# Patient Record
Sex: Female | Born: 1970 | Race: Black or African American | Hispanic: No | Marital: Married | Smoking: Never smoker
Health system: Southern US, Community
[De-identification: ages and names within clinical notes are randomized; demographics above are authoritative.]

## PROBLEM LIST (undated history)

## (undated) DIAGNOSIS — K219 Gastro-esophageal reflux disease without esophagitis: Secondary | ICD-10-CM

## (undated) DIAGNOSIS — C801 Malignant (primary) neoplasm, unspecified: Secondary | ICD-10-CM

## (undated) DIAGNOSIS — K0889 Other specified disorders of teeth and supporting structures: Secondary | ICD-10-CM

## (undated) DIAGNOSIS — D649 Anemia, unspecified: Secondary | ICD-10-CM

## (undated) DIAGNOSIS — I10 Essential (primary) hypertension: Secondary | ICD-10-CM

## (undated) DIAGNOSIS — L02213 Cutaneous abscess of chest wall: Secondary | ICD-10-CM

## (undated) DIAGNOSIS — L739 Follicular disorder, unspecified: Secondary | ICD-10-CM

## (undated) HISTORY — DX: Cutaneous abscess of chest wall: L02.213

## (undated) HISTORY — DX: Essential (primary) hypertension: I10

## (undated) HISTORY — DX: Malignant (primary) neoplasm, unspecified: C80.1

## (undated) HISTORY — PX: OTHER SURGICAL HISTORY: SHX169

## (undated) HISTORY — PX: BREAST REDUCTION SURGERY: SHX8

## (undated) HISTORY — PX: EYE SURGERY: SHX253

## (undated) HISTORY — DX: Follicular disorder, unspecified: L73.9

## (undated) HISTORY — PX: MASTECTOMY: SHX3

## (undated) HISTORY — PX: INCISE AND DRAIN ABCESS: PRO64

---

## 1998-12-12 ENCOUNTER — Encounter (INDEPENDENT_AMBULATORY_CARE_PROVIDER_SITE_OTHER): Payer: Self-pay | Admitting: *Deleted

## 1998-12-12 ENCOUNTER — Other Ambulatory Visit: Admission: RE | Admit: 1998-12-12 | Discharge: 1998-12-12 | Payer: Self-pay | Admitting: Obstetrics and Gynecology

## 1999-02-12 ENCOUNTER — Other Ambulatory Visit: Admission: RE | Admit: 1999-02-12 | Discharge: 1999-02-12 | Payer: Self-pay | Admitting: Obstetrics and Gynecology

## 1999-02-12 ENCOUNTER — Encounter (INDEPENDENT_AMBULATORY_CARE_PROVIDER_SITE_OTHER): Payer: Self-pay | Admitting: Specialist

## 2001-10-16 ENCOUNTER — Other Ambulatory Visit: Admission: RE | Admit: 2001-10-16 | Discharge: 2001-10-16 | Payer: Self-pay | Admitting: Obstetrics and Gynecology

## 2002-01-08 ENCOUNTER — Encounter: Admission: RE | Admit: 2002-01-08 | Discharge: 2002-01-08 | Payer: Self-pay | Admitting: Family Medicine

## 2002-01-08 ENCOUNTER — Encounter: Payer: Self-pay | Admitting: Family Medicine

## 2002-06-14 HISTORY — PX: BREAST SURGERY: SHX581

## 2002-08-07 ENCOUNTER — Encounter: Payer: Self-pay | Admitting: Surgery

## 2002-08-09 ENCOUNTER — Encounter (INDEPENDENT_AMBULATORY_CARE_PROVIDER_SITE_OTHER): Payer: Self-pay | Admitting: Specialist

## 2002-08-09 ENCOUNTER — Observation Stay (HOSPITAL_COMMUNITY): Admission: RE | Admit: 2002-08-09 | Discharge: 2002-08-10 | Payer: Self-pay | Admitting: Surgery

## 2002-09-17 ENCOUNTER — Other Ambulatory Visit: Admission: RE | Admit: 2002-09-17 | Discharge: 2002-09-17 | Payer: Self-pay | Admitting: Plastic Surgery

## 2002-10-15 ENCOUNTER — Encounter: Payer: Self-pay | Admitting: Oncology

## 2002-10-15 ENCOUNTER — Encounter: Admission: RE | Admit: 2002-10-15 | Discharge: 2002-10-15 | Payer: Self-pay | Admitting: Oncology

## 2002-10-23 ENCOUNTER — Other Ambulatory Visit: Admission: RE | Admit: 2002-10-23 | Discharge: 2002-10-23 | Payer: Self-pay | Admitting: Obstetrics and Gynecology

## 2003-04-12 ENCOUNTER — Encounter: Admission: RE | Admit: 2003-04-12 | Discharge: 2003-04-12 | Payer: Self-pay | Admitting: Oncology

## 2003-11-05 ENCOUNTER — Other Ambulatory Visit: Admission: RE | Admit: 2003-11-05 | Discharge: 2003-11-05 | Payer: Self-pay | Admitting: Obstetrics and Gynecology

## 2004-04-14 ENCOUNTER — Encounter: Admission: RE | Admit: 2004-04-14 | Discharge: 2004-04-14 | Payer: Self-pay | Admitting: Oncology

## 2004-07-08 ENCOUNTER — Ambulatory Visit (HOSPITAL_BASED_OUTPATIENT_CLINIC_OR_DEPARTMENT_OTHER): Admission: RE | Admit: 2004-07-08 | Discharge: 2004-07-08 | Payer: Self-pay | Admitting: Plastic Surgery

## 2004-08-12 ENCOUNTER — Ambulatory Visit (HOSPITAL_COMMUNITY): Admission: RE | Admit: 2004-08-12 | Discharge: 2004-08-12 | Payer: Self-pay | Admitting: Plastic Surgery

## 2004-08-27 ENCOUNTER — Ambulatory Visit: Payer: Self-pay | Admitting: Oncology

## 2004-12-22 ENCOUNTER — Ambulatory Visit: Payer: Self-pay | Admitting: Oncology

## 2005-04-09 ENCOUNTER — Ambulatory Visit: Payer: Self-pay | Admitting: Oncology

## 2005-04-15 ENCOUNTER — Encounter: Admission: RE | Admit: 2005-04-15 | Discharge: 2005-04-15 | Payer: Self-pay | Admitting: Oncology

## 2005-06-08 ENCOUNTER — Ambulatory Visit: Payer: Self-pay | Admitting: Oncology

## 2005-06-18 ENCOUNTER — Inpatient Hospital Stay (HOSPITAL_COMMUNITY): Admission: RE | Admit: 2005-06-18 | Discharge: 2005-06-22 | Payer: Self-pay | Admitting: Plastic Surgery

## 2005-08-03 ENCOUNTER — Ambulatory Visit: Payer: Self-pay | Admitting: Oncology

## 2006-01-26 ENCOUNTER — Ambulatory Visit: Payer: Self-pay | Admitting: Oncology

## 2006-01-26 LAB — CBC WITH DIFFERENTIAL/PLATELET
BASO%: 0.6 % (ref 0.0–2.0)
Eosinophils Absolute: 0.3 10*3/uL (ref 0.0–0.5)
MCV: 88.6 fL (ref 81.0–101.0)
MONO%: 6 % (ref 0.0–13.0)
NEUT#: 3.2 10*3/uL (ref 1.5–6.5)
RBC: 3.81 10*6/uL (ref 3.70–5.32)
RDW: 13.5 % (ref 11.3–14.5)
WBC: 6.2 10*3/uL (ref 3.9–10.0)

## 2006-01-26 LAB — COMPREHENSIVE METABOLIC PANEL
ALT: 13 U/L (ref 0–40)
AST: 15 U/L (ref 0–37)
Albumin: 4.1 g/dL (ref 3.5–5.2)
Alkaline Phosphatase: 61 U/L (ref 39–117)
Glucose, Bld: 88 mg/dL (ref 70–99)
Potassium: 4.4 mEq/L (ref 3.5–5.3)
Sodium: 137 mEq/L (ref 135–145)
Total Protein: 7.9 g/dL (ref 6.0–8.3)

## 2006-01-26 LAB — IRON AND TIBC: Iron: 90 ug/dL (ref 42–145)

## 2006-04-18 ENCOUNTER — Encounter: Admission: RE | Admit: 2006-04-18 | Discharge: 2006-04-18 | Payer: Self-pay | Admitting: Oncology

## 2006-06-03 ENCOUNTER — Ambulatory Visit (HOSPITAL_COMMUNITY): Admission: RE | Admit: 2006-06-03 | Discharge: 2006-06-03 | Payer: Self-pay | Admitting: Obstetrics and Gynecology

## 2006-07-21 ENCOUNTER — Ambulatory Visit: Payer: Self-pay | Admitting: Oncology

## 2006-07-26 LAB — COMPREHENSIVE METABOLIC PANEL
Alkaline Phosphatase: 62 U/L (ref 39–117)
CO2: 21 mEq/L (ref 19–32)
Creatinine, Ser: 1.47 mg/dL — ABNORMAL HIGH (ref 0.40–1.20)
Glucose, Bld: 88 mg/dL (ref 70–99)
Total Bilirubin: 0.4 mg/dL (ref 0.3–1.2)

## 2006-07-26 LAB — CBC WITH DIFFERENTIAL/PLATELET
BASO%: 0.4 % (ref 0.0–2.0)
Eosinophils Absolute: 0.2 10*3/uL (ref 0.0–0.5)
HCT: 33.5 % — ABNORMAL LOW (ref 34.8–46.6)
LYMPH%: 38.8 % (ref 14.0–48.0)
MCHC: 35.1 g/dL (ref 32.0–36.0)
MCV: 86 fL (ref 81.0–101.0)
MONO#: 0.8 10*3/uL (ref 0.1–0.9)
MONO%: 8.7 % (ref 0.0–13.0)
NEUT%: 49.9 % (ref 39.6–76.8)
Platelets: 328 10*3/uL (ref 145–400)
WBC: 9.6 10*3/uL (ref 3.9–10.0)

## 2006-09-06 ENCOUNTER — Ambulatory Visit (HOSPITAL_COMMUNITY): Admission: RE | Admit: 2006-09-06 | Discharge: 2006-09-06 | Payer: Self-pay | Admitting: Obstetrics and Gynecology

## 2007-01-20 ENCOUNTER — Ambulatory Visit: Payer: Self-pay | Admitting: Oncology

## 2007-01-24 LAB — CBC WITH DIFFERENTIAL/PLATELET
Basophils Absolute: 0.1 10*3/uL (ref 0.0–0.1)
Eosinophils Absolute: 0.3 10*3/uL (ref 0.0–0.5)
HCT: 33.4 % — ABNORMAL LOW (ref 34.8–46.6)
LYMPH%: 35.7 % (ref 14.0–48.0)
MONO#: 0.8 10*3/uL (ref 0.1–0.9)
NEUT#: 4.9 10*3/uL (ref 1.5–6.5)
NEUT%: 51.5 % (ref 39.6–76.8)
Platelets: 308 10*3/uL (ref 145–400)
WBC: 9.6 10*3/uL (ref 3.9–10.0)

## 2007-01-24 LAB — COMPREHENSIVE METABOLIC PANEL
BUN: 18 mg/dL (ref 6–23)
CO2: 19 mEq/L (ref 19–32)
Creatinine, Ser: 1.28 mg/dL — ABNORMAL HIGH (ref 0.40–1.20)
Glucose, Bld: 113 mg/dL — ABNORMAL HIGH (ref 70–99)
Total Bilirubin: 0.3 mg/dL (ref 0.3–1.2)
Total Protein: 7.7 g/dL (ref 6.0–8.3)

## 2007-04-24 ENCOUNTER — Encounter: Admission: RE | Admit: 2007-04-24 | Discharge: 2007-04-24 | Payer: Self-pay | Admitting: Oncology

## 2007-04-28 ENCOUNTER — Encounter: Admission: RE | Admit: 2007-04-28 | Discharge: 2007-04-28 | Payer: Self-pay | Admitting: Oncology

## 2007-07-31 ENCOUNTER — Ambulatory Visit: Payer: Self-pay | Admitting: Oncology

## 2007-08-02 LAB — CBC WITH DIFFERENTIAL/PLATELET
Basophils Absolute: 0 10*3/uL (ref 0.0–0.1)
EOS%: 2.7 % (ref 0.0–7.0)
HCT: 33.3 % — ABNORMAL LOW (ref 34.8–46.6)
HGB: 11.5 g/dL — ABNORMAL LOW (ref 11.6–15.9)
LYMPH%: 35.2 % (ref 14.0–48.0)
MCH: 29.9 pg (ref 26.0–34.0)
MCV: 86.6 fL (ref 81.0–101.0)
MONO%: 8 % (ref 0.0–13.0)
NEUT%: 53.7 % (ref 39.6–76.8)
Platelets: 313 10*3/uL (ref 145–400)

## 2007-08-02 LAB — BASIC METABOLIC PANEL
BUN: 14 mg/dL (ref 6–23)
Chloride: 102 mEq/L (ref 96–112)
Creatinine, Ser: 1.08 mg/dL (ref 0.40–1.20)

## 2008-01-28 ENCOUNTER — Ambulatory Visit: Payer: Self-pay | Admitting: Oncology

## 2008-01-31 LAB — CBC WITH DIFFERENTIAL/PLATELET
BASO%: 0.5 % (ref 0.0–2.0)
Basophils Absolute: 0 10*3/uL (ref 0.0–0.1)
EOS%: 25.4 % — ABNORMAL HIGH (ref 0.0–7.0)
HCT: 32.7 % — ABNORMAL LOW (ref 34.8–46.6)
HGB: 11.1 g/dL — ABNORMAL LOW (ref 11.6–15.9)
MCH: 29.8 pg (ref 26.0–34.0)
MCHC: 34.1 g/dL (ref 32.0–36.0)
MCV: 87.5 fL (ref 81.0–101.0)
MONO%: 6.1 % (ref 0.0–13.0)
NEUT%: 34.3 % — ABNORMAL LOW (ref 39.6–76.8)
RDW: 15.3 % — ABNORMAL HIGH (ref 11.3–14.5)

## 2008-01-31 LAB — COMPREHENSIVE METABOLIC PANEL
AST: 15 U/L (ref 0–37)
Alkaline Phosphatase: 73 U/L (ref 39–117)
BUN: 10 mg/dL (ref 6–23)
Creatinine, Ser: 0.92 mg/dL (ref 0.40–1.20)

## 2008-04-25 ENCOUNTER — Encounter: Admission: RE | Admit: 2008-04-25 | Discharge: 2008-04-25 | Payer: Self-pay | Admitting: Oncology

## 2009-03-17 ENCOUNTER — Ambulatory Visit: Payer: Self-pay | Admitting: Oncology

## 2009-03-19 LAB — COMPREHENSIVE METABOLIC PANEL
ALT: 13 U/L (ref 0–35)
AST: 15 U/L (ref 0–37)
Albumin: 3.8 g/dL (ref 3.5–5.2)
Alkaline Phosphatase: 71 U/L (ref 39–117)
BUN: 8 mg/dL (ref 6–23)
Chloride: 105 mEq/L (ref 96–112)
Potassium: 4.3 mEq/L (ref 3.5–5.3)

## 2009-03-19 LAB — CBC WITH DIFFERENTIAL/PLATELET
Basophils Absolute: 0 10*3/uL (ref 0.0–0.1)
Eosinophils Absolute: 0.5 10*3/uL (ref 0.0–0.5)
HGB: 12 g/dL (ref 11.6–15.9)
MONO#: 0.5 10*3/uL (ref 0.1–0.9)
MONO%: 6.5 % (ref 0.0–14.0)
NEUT#: 4.3 10*3/uL (ref 1.5–6.5)
RBC: 3.96 10*6/uL (ref 3.70–5.45)
RDW: 14.9 % — ABNORMAL HIGH (ref 11.2–14.5)
WBC: 7.6 10*3/uL (ref 3.9–10.3)
lymph#: 2.3 10*3/uL (ref 0.9–3.3)

## 2009-03-19 LAB — MORPHOLOGY: PLT EST: ADEQUATE

## 2009-04-17 ENCOUNTER — Ambulatory Visit: Payer: Self-pay | Admitting: Genetic Counselor

## 2009-04-28 ENCOUNTER — Encounter: Admission: RE | Admit: 2009-04-28 | Discharge: 2009-04-28 | Payer: Self-pay | Admitting: Oncology

## 2009-04-30 ENCOUNTER — Encounter: Admission: RE | Admit: 2009-04-30 | Discharge: 2009-04-30 | Payer: Self-pay | Admitting: Oncology

## 2009-11-20 ENCOUNTER — Ambulatory Visit: Payer: Self-pay | Admitting: Diagnostic Radiology

## 2009-11-20 ENCOUNTER — Emergency Department (HOSPITAL_BASED_OUTPATIENT_CLINIC_OR_DEPARTMENT_OTHER): Admission: EM | Admit: 2009-11-20 | Discharge: 2009-11-20 | Payer: Self-pay | Admitting: Emergency Medicine

## 2010-03-18 ENCOUNTER — Ambulatory Visit: Payer: Self-pay | Admitting: Oncology

## 2010-03-18 ENCOUNTER — Ambulatory Visit: Payer: Self-pay | Admitting: Genetic Counselor

## 2010-03-18 LAB — COMPREHENSIVE METABOLIC PANEL
ALT: 14 U/L (ref 0–35)
AST: 16 U/L (ref 0–37)
Albumin: 4 g/dL (ref 3.5–5.2)
Alkaline Phosphatase: 78 U/L (ref 39–117)
BUN: 9 mg/dL (ref 6–23)
CO2: 25 mEq/L (ref 19–32)
Calcium: 9.3 mg/dL (ref 8.4–10.5)
Chloride: 102 mEq/L (ref 96–112)
Creatinine, Ser: 0.95 mg/dL (ref 0.40–1.20)
Glucose, Bld: 120 mg/dL — ABNORMAL HIGH (ref 70–99)
Potassium: 3.8 mEq/L (ref 3.5–5.3)
Sodium: 136 mEq/L (ref 135–145)
Total Bilirubin: 0.7 mg/dL (ref 0.3–1.2)
Total Protein: 7.2 g/dL (ref 6.0–8.3)

## 2010-03-18 LAB — CBC WITH DIFFERENTIAL/PLATELET
BASO%: 0.7 % (ref 0.0–2.0)
Basophils Absolute: 0 10*3/uL (ref 0.0–0.1)
EOS%: 3.4 % (ref 0.0–7.0)
Eosinophils Absolute: 0.2 10*3/uL (ref 0.0–0.5)
HCT: 36.2 % (ref 34.8–46.6)
HGB: 12 g/dL (ref 11.6–15.9)
LYMPH%: 33.2 % (ref 14.0–49.7)
MCH: 29.7 pg (ref 25.1–34.0)
MCHC: 33 g/dL (ref 31.5–36.0)
MCV: 89.9 fL (ref 79.5–101.0)
MONO#: 0.3 10*3/uL (ref 0.1–0.9)
MONO%: 4.6 % (ref 0.0–14.0)
NEUT#: 4.1 10*3/uL (ref 1.5–6.5)
NEUT%: 58.1 % (ref 38.4–76.8)
Platelets: 321 10*3/uL (ref 145–400)
RBC: 4.03 10*6/uL (ref 3.70–5.45)
RDW: 15 % — ABNORMAL HIGH (ref 11.2–14.5)
WBC: 7.1 10*3/uL (ref 3.9–10.3)
lymph#: 2.4 10*3/uL (ref 0.9–3.3)

## 2010-03-18 LAB — TSH: TSH: 3.593 u[IU]/mL (ref 0.350–4.500)

## 2010-05-06 ENCOUNTER — Encounter: Admission: RE | Admit: 2010-05-06 | Discharge: 2010-05-06 | Payer: Self-pay | Admitting: Oncology

## 2010-06-24 ENCOUNTER — Ambulatory Visit: Payer: Self-pay | Admitting: Oncology

## 2010-07-04 ENCOUNTER — Encounter: Payer: Self-pay | Admitting: Oncology

## 2010-08-31 LAB — CBC
HCT: 37.7 % (ref 36.0–46.0)
Hemoglobin: 12.4 g/dL (ref 12.0–15.0)
MCHC: 32.9 g/dL (ref 30.0–36.0)
MCV: 89.8 fL (ref 78.0–100.0)
Platelets: 278 10*3/uL (ref 150–400)
RBC: 4.2 MIL/uL (ref 3.87–5.11)
RDW: 13.9 % (ref 11.5–15.5)
WBC: 8.4 10*3/uL (ref 4.0–10.5)

## 2010-08-31 LAB — DIFFERENTIAL
Basophils Absolute: 0.2 10*3/uL — ABNORMAL HIGH (ref 0.0–0.1)
Basophils Relative: 2 % — ABNORMAL HIGH (ref 0–1)
Eosinophils Absolute: 0.3 10*3/uL (ref 0.0–0.7)
Eosinophils Relative: 4 % (ref 0–5)
Lymphocytes Relative: 35 % (ref 12–46)
Lymphs Abs: 2.9 10*3/uL (ref 0.7–4.0)
Monocytes Absolute: 0.6 10*3/uL (ref 0.1–1.0)
Monocytes Relative: 7 % (ref 3–12)
Neutro Abs: 4.4 10*3/uL (ref 1.7–7.7)
Neutrophils Relative %: 53 % (ref 43–77)

## 2010-08-31 LAB — COMPREHENSIVE METABOLIC PANEL
ALT: 16 U/L (ref 0–35)
AST: 25 U/L (ref 0–37)
Albumin: 4.1 g/dL (ref 3.5–5.2)
Alkaline Phosphatase: 89 U/L (ref 39–117)
BUN: 9 mg/dL (ref 6–23)
CO2: 25 mEq/L (ref 19–32)
Calcium: 9.6 mg/dL (ref 8.4–10.5)
Chloride: 104 mEq/L (ref 96–112)
Creatinine, Ser: 0.9 mg/dL (ref 0.4–1.2)
GFR calc Af Amer: >60 mL/min (ref >60)
GFR calc non Af Amer: >60 mL/min (ref >60)
Glucose, Bld: 94 mg/dL (ref 70–99)
Potassium: 4.2 mEq/L (ref 3.5–5.1)
Sodium: 142 mEq/L (ref 135–145)
Total Bilirubin: 0.7 mg/dL (ref 0.3–1.2)
Total Protein: 8.5 g/dL — ABNORMAL HIGH (ref 6.0–8.3)

## 2010-08-31 LAB — POCT CARDIAC MARKERS
CKMB, poc: <1 ng/mL — ABNORMAL LOW (ref 1.0–8.0)
CKMB, poc: <1 ng/mL — ABNORMAL LOW (ref 1.0–8.0)
Myoglobin, poc: 72 ng/mL (ref 12–200)
Myoglobin, poc: 75 ng/mL (ref 12–200)
Troponin i, poc: <0.05 ng/mL (ref 0.00–0.09)
Troponin i, poc: <0.05 ng/mL (ref 0.00–0.09)

## 2010-08-31 LAB — LIPASE, BLOOD: Lipase: 94 U/L (ref 23–300)

## 2010-08-31 LAB — D-DIMER, QUANTITATIVE: D-Dimer, Quant: 0.24 ug/mL-FEU (ref 0.00–0.48)

## 2010-08-31 LAB — PREGNANCY, URINE: Preg Test, Ur: NEGATIVE

## 2010-10-30 NOTE — H&P (Signed)
[name], [name]                ACCOUNT NO.:  [id number]   MEDICAL RECORD NO.:  [id number]          PATIENT TYPE:  INP   LOCATION:  2899                         FACILITY:  [facility]   PHYSICIAN:  Consuello Bossier., M.D.DATE OF BIRTH:  21-Oct-1970   DATE OF ADMISSION:  06/18/2005  DATE OF DISCHARGE:                                HISTORY & PHYSICAL   HISTORY OF PRESENT ILLNESS:  This 40 year old female is admitted for delayed  breast reconstruction.  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  After much deliberation, decided to proceed with a right total  mastectomy.  She has been treated with tamoxifen since that time.  She is  overweight, and because of this and because of difficulty in losing weight,  it was elected to delay a TRAM flap in January of this past year.  She was  scheduled to have a right TRAM flap performed subsequent to that time, but  was noted to have a low hemoglobin.  She has been seen by Dr. Ottie Glazier P.  Darrold Span and has been placed on various medications, including ferrous  sulfate, to bring her hemoglobin up as much as it could be brought up.  Also  plans were made to obtain an autologous transfusion to be used if necessary.  She is admitted to the hospital at this time for the surgery.   PAST MEDICAL HISTORY:  She had some gastroesophageal reflux disease.  Was  taking the tamoxifen and the ferrous sulfate, but otherwise was healthy with  no long-term medical problems.   REVIEW OF SYSTEMS:  Noncontributory.   PHYSICAL EXAMINATION:  GENERAL:  The patient is a cooperative, 40 year old  female, in no distress.  HEENT:  Negative.  LUNGS:  Negative.  HEART:  Negative.  ABDOMEN:  Soft, no organomegaly, masses or tenderness.  There is a lower  abdominal transverse scar from her previous delay  of the TRAM flap, which is  well-healed.  BREASTS:  There is an absent right breast with a transverse mastectomy scar  with stitch marks on either side.  There was no local evidence of chest wall  disease or axillary or supraclavicular adenopathy.  The left breast, which  is reasonably large, has the scars following her previous reduction  mammoplasty.  EXTREMITIES:  No peripheral clubbing, cyanosis or edema.   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   DISPOSITION:  As noted above, the patient is admitted at this point for the  delayed breast reconstruction, following the delay of the TRAM flap and  treatment of her underlying chronic anemia.  She has been appraised of the  operation, as well as the risks, including the possibility of complications  related to the flap, such as the possibility of  flap failure, untoward scarring, the fact that multiple procedures normally  required and breast reconstruction, to  achieve maximum breast symmetry, the  possibility of infection, bleeding or other things that are more common in  patients who are overweight than ones who are not.  She is anxious to  proceed at this point.      Consuello Bossier., M.D.  Electronically Signed     HH/MEDQ  D:  06/18/2005  T:  06/18/2005  Job:  [id number]

## 2010-10-30 NOTE — Discharge Summary (Signed)
Courtney [name], Courtney [name]                ACCOUNT NO.:  [id number]   MEDICAL RECORD NO.:  [id number]          PATIENT TYPE:  INP   LOCATION:  5710                         FACILITY:  [facility]   PHYSICIAN:  Consuello Bossier., M.D.DATE OF BIRTH:  05-07-1971   DATE OF ADMISSION:  06/18/2005  DATE OF DISCHARGE:  06/22/2005                                 DISCHARGE SUMMARY   HISTORY OF PRESENT ILLNESS:  This is a 40 year old female who is admitted  for delayed breast construction.  The patient had previously undergone  bilateral mammoplasty and carcinoma  was found in the right breast specimen  and she subsequently underwent a right total mastectomy.  She has been on  Tamoxifen since that time.  She was a candidate for breast reconstruction  and had a delay of her tram flap because of her being so heavy and then when  she was scheduled to have her tram flap performed she was noted to be  anemic.  She was then evaluated for the anemia and blood count is raised and  currently is on iron.  She was admitted for the above surgical procedure.   PAST MEDICAL HISTORY AND REVIEW SYSTEMS:  Essentially noncontributory.   PERTINENT PHYSICAL EXAMINATION:  Showed an absent right breast with a  transverse mastectomy scar with stitch marks on either side.  No evidence of  any local chest wall disease, axillary or supraclavicular adenopathy.  The  left breast is reasonably enlarged and has a scar from previous left  reduction mammoplasty.   LABORATORY DATA:  Showed a hemoglobin of 12.4, hematocrit 36.7 on admission.   HOSPITAL COURSE:  Hospital course in the morning of admission, the patient  was taken to the operating room under general anesthesia of the tram flap of  right breast reconstruction was performed.  Postoperatively she has done  well.  Her hemoglobin dropped initially to 9.1 on January 5, but on January  6, was 10.1 and she was doing well.  She was able to be discharged and due  to be followed by  me as an outpatient.  She is on no pain medication and is  back on her iron and Senokot and Tamoxifen. I will see her in the office in  3 days for removal of her drains or before if any problems.      Consuello Bossier., M.D.  Electronically Signed     HH/MEDQ  D:  06/22/2005  T:  06/22/2005  Job:  [id number]

## 2010-10-30 NOTE — Op Note (Signed)
NAMESHARIFAH, Courtney [name]                ACCOUNT NO.:  [id number]   MEDICAL RECORD NO.:  [id number]          PATIENT TYPE:  AMB   LOCATION:  SDC                           FACILITY:  [facility]   PHYSICIAN:  Sherry A. Dickstein, M.D.DATE OF BIRTH:  [birth year]   DATE OF PROCEDURE:  06/03/2006  DATE OF DISCHARGE:                               OPERATIVE REPORT   PREOPERATIVE DIAGNOSIS:  Desire for sterilization.   POSTOPERATIVE DIAGNOSIS:  Desire for sterilization.   PROCEDURE:  Essure tubal occlusion.   SURGEON:  Sherry A. Rosalio Macadamia, M.D.   ANESTHESIA:  MAC.   INDICATIONS:  This is a 40 year old G1, P1-0-0-1 woman who requests  permanent sterilization procedure.  She understands the risks involved  as well as the failure rate to this procedure.  She requests only Essure  procedure.  She would not agreed to a laparoscopic tubal procedure.  The  patient is status post breast cancer and is currently on tamoxifen.   FINDINGS:  A normal sized anteflexed uterus with no endometrial polyps.  Both cornua could be visualized.  No adnexal masses.   PROCEDURE:  The patient is brought into the operating room and given  adequate IV sedation.  She was placed in dorsal lithotomy position.  Her  perineum was washed with Betadine.  Pelvic examination was performed.  She was draped in a sterile fashion.  Surgeon's gown and gloves were  changed.  Speculum was placed within the vagina.  Vagina was washed with  Betadine.  Paracervical block was administered with 1% Nesacaine.  Anterior lip of the cervix was grasped with single-tooth tenaculum.  Cervix was sounded.  Cervix was dilated with a Pratt dilator just to a  #17.  The hysteroscope was introduced into the endometrial cavity.  Pictures were obtained, both cornua were visualized.  The Essure  instrument was able to be threaded into the left cornua without any  difficulty.  It was deployed in the normal fashion and released.  After  being released,  approximately two coils were visualized, picture was  obtained.  The same procedure was then performed on the right cornua.  After it was deployed and released, approximately four coils were  visualized.  After revisualization the left cornua, the coils could not  be visualized at the end of the procedure, although they had been  visualized at the end of deployment of the instrument.  At the end of  the procedure, the entire cavity was visualized and no polyps were seen  in the endometrial cavity, no thickening of the endometrial tissue was  seen.  All instruments removed from the vagina.  The patient was taken  out of the dorsal lithotomy position.  She was awakened.  She was moved  from the operating table to a stretcher in stable condition.   COMPLICATIONS:  None.   ESTIMATED BLOOD LOSS:  Less than 5 mL.   SORBITOL DIFFERENTIAL:  Minus 50 mL.      Sherry A. Rosalio Macadamia, M.D.  Electronically Signed     SAD/MEDQ  D:  06/03/2006  T:  06/03/2006  Job:  [id number]

## 2011-03-23 ENCOUNTER — Other Ambulatory Visit: Payer: Self-pay | Admitting: Oncology

## 2011-03-23 ENCOUNTER — Encounter (HOSPITAL_BASED_OUTPATIENT_CLINIC_OR_DEPARTMENT_OTHER): Payer: BC Managed Care – PPO | Admitting: Oncology

## 2011-03-23 DIAGNOSIS — N6009 Solitary cyst of unspecified breast: Secondary | ICD-10-CM

## 2011-03-23 DIAGNOSIS — K5900 Constipation, unspecified: Secondary | ICD-10-CM

## 2011-03-23 DIAGNOSIS — N649 Disorder of breast, unspecified: Secondary | ICD-10-CM

## 2011-03-23 DIAGNOSIS — L659 Nonscarring hair loss, unspecified: Secondary | ICD-10-CM

## 2011-03-23 DIAGNOSIS — Z853 Personal history of malignant neoplasm of breast: Secondary | ICD-10-CM

## 2011-03-23 DIAGNOSIS — Z87898 Personal history of other specified conditions: Secondary | ICD-10-CM

## 2011-03-23 LAB — COMPREHENSIVE METABOLIC PANEL
AST: 20 U/L (ref 0–37)
Alkaline Phosphatase: 71 U/L (ref 39–117)
BUN: 6 mg/dL (ref 6–23)
Calcium: 8.9 mg/dL (ref 8.4–10.5)
Chloride: 105 mEq/L (ref 96–112)
Creatinine, Ser: 1.04 mg/dL (ref 0.50–1.10)
Glucose, Bld: 97 mg/dL (ref 70–99)

## 2011-05-10 ENCOUNTER — Other Ambulatory Visit: Payer: BC Managed Care – PPO

## 2011-05-13 ENCOUNTER — Ambulatory Visit
Admission: RE | Admit: 2011-05-13 | Discharge: 2011-05-13 | Disposition: A | Discharge status: Home / Self Care | Payer: BC Managed Care – PPO | Source: Ambulatory Visit | Attending: Oncology | Admitting: Oncology

## 2011-05-13 DIAGNOSIS — Z853 Personal history of malignant neoplasm of breast: Secondary | ICD-10-CM

## 2011-05-13 DIAGNOSIS — N6009 Solitary cyst of unspecified breast: Secondary | ICD-10-CM

## 2011-05-19 ENCOUNTER — Other Ambulatory Visit: Payer: Self-pay | Admitting: Diagnostic Radiology

## 2011-05-19 ENCOUNTER — Other Ambulatory Visit: Payer: Self-pay | Admitting: Oncology

## 2011-05-19 ENCOUNTER — Ambulatory Visit
Admission: RE | Admit: 2011-05-19 | Discharge: 2011-05-19 | Disposition: A | Discharge status: Home / Self Care | Payer: BC Managed Care – PPO | Source: Ambulatory Visit | Attending: Oncology | Admitting: Oncology

## 2011-05-19 DIAGNOSIS — N644 Mastodynia: Secondary | ICD-10-CM

## 2011-05-19 MED ORDER — CEPHALEXIN 500 MG PO CAPS: 500.0000 mg | ORAL_CAPSULE | Freq: Two times a day (BID) | ORAL | Status: AC

## 2011-06-02 ENCOUNTER — Ambulatory Visit
Admission: RE | Admit: 2011-06-02 | Discharge: 2011-06-02 | Disposition: A | Discharge status: Home / Self Care | Payer: BC Managed Care – PPO | Source: Ambulatory Visit | Attending: Oncology | Admitting: Oncology

## 2011-06-02 DIAGNOSIS — N644 Mastodynia: Secondary | ICD-10-CM

## 2011-06-03 ENCOUNTER — Ambulatory Visit (INDEPENDENT_AMBULATORY_CARE_PROVIDER_SITE_OTHER): Payer: BC Managed Care – PPO | Admitting: General Surgery

## 2011-06-03 ENCOUNTER — Other Ambulatory Visit (INDEPENDENT_AMBULATORY_CARE_PROVIDER_SITE_OTHER): Payer: Self-pay | Admitting: General Surgery

## 2011-06-03 ENCOUNTER — Encounter (INDEPENDENT_AMBULATORY_CARE_PROVIDER_SITE_OTHER): Payer: Self-pay | Admitting: General Surgery

## 2011-06-03 VITALS — BP 128/78 | HR 78 | Resp 18 | Ht 65.0 in | Wt 292.5 lb

## 2011-06-03 DIAGNOSIS — L03319 Cellulitis of trunk, unspecified: Secondary | ICD-10-CM

## 2011-06-03 DIAGNOSIS — L02213 Cutaneous abscess of chest wall: Secondary | ICD-10-CM

## 2011-06-03 NOTE — Progress Notes (Signed)
Addended by: Latricia Heft on: 06/03/2011 05:06 PM   Modules accepted: Orders

## 2011-06-03 NOTE — Progress Notes (Signed)
Subjective:     Patient ID: Courtney [name], female   DOB: 01/30/1971, 40 y.o.   MRN: [id number]  HPI Patient has a history of several weeks of an abscess on her left chest wall just below her breast. She's had a previous reduction mammoplasty. She was treated with antibiotics previously. The area has not completely healed. It drained spontaneously but has been closed for some time. Ultrasound at the breast Center demonstrated a 3 cm abscess and she comes for urgent clinic. Dr. Jean Rosenthal asked [place] to see her in consultation.  Review of Systems     Objective:   Physical Exam  Constitutional: She is oriented to person, place, and time.  Pulmonary/Chest: Effort normal and breath sounds normal. No respiratory distress. She has no wheezes.       Status post reduction mammoplasty. There is a 3 cm x 1 cm abscess on the chest wall just inferior to the leftbreast. There is fluctuance and tenderness present.  Abdominal: Soft. There is no tenderness.  Neurological: She is alert and oriented to person, place, and time.  Procedure: The area was prepped in sterile fashion. Local anesthetic was injected. A transverse incision was made with some clear fluid returned. This was sent for culture. The patient did not tolerate this very well so we were not able to fully explore the wound a gauze dressing was applied. The     Assessment:     Chest wall abscess beneath left breast    Plan:     This was incised and drained this documented above. I will place her on doxycycline. Wound care instructions were given. We'll see her back next week.

## 2011-06-04 ENCOUNTER — Telehealth (INDEPENDENT_AMBULATORY_CARE_PROVIDER_SITE_OTHER): Payer: Self-pay

## 2011-06-04 ENCOUNTER — Telehealth (INDEPENDENT_AMBULATORY_CARE_PROVIDER_SITE_OTHER): Payer: Self-pay | Admitting: General Surgery

## 2011-06-04 NOTE — Telephone Encounter (Signed)
Pt called in questiong what Rx you were going to call in for her last night at [facility] on [address] b/c they say they never go it. Pls call the Rx in for her and then call her at [phone] to let her know please./ [facility]

## 2011-06-04 NOTE — Telephone Encounter (Signed)
[facility] called to [facility] for Doxycycline 100mg  disp 14 sig 1bid

## 2011-06-06 LAB — WOUND CULTURE
Gram Stain: NONE SEEN
Organism ID, Bacteria: NO GROWTH

## 2011-06-09 ENCOUNTER — Ambulatory Visit (INDEPENDENT_AMBULATORY_CARE_PROVIDER_SITE_OTHER): Payer: BC Managed Care – PPO | Admitting: General Surgery

## 2011-06-09 ENCOUNTER — Encounter (INDEPENDENT_AMBULATORY_CARE_PROVIDER_SITE_OTHER): Payer: Self-pay | Admitting: General Surgery

## 2011-06-09 VITALS — BP 126/72 | HR 68 | Temp 97.8°F | Resp 18 | Ht 64.5 in | Wt 291.0 lb

## 2011-06-09 DIAGNOSIS — L02219 Cutaneous abscess of trunk, unspecified: Secondary | ICD-10-CM

## 2011-06-09 DIAGNOSIS — L02213 Cutaneous abscess of chest wall: Secondary | ICD-10-CM

## 2011-06-09 NOTE — Progress Notes (Signed)
Subjective:     Patient ID: Courtney [name], female   DOB: 06/13/1971, 40 y.o.   MRN: [id number]  HPI  Patient had I&D left chest wall abscess 6 days ago. Cultures have been negative. She is completing a course of antibiotics. Review of Systems     Objective:   Physical Exam On exam the I&D site is nearly healed. There is no evidence of ongoing infection. There's no drainage.    Assessment:        Status post incision and drainage of left chest wall abscess doing much better Plan:     Continue local care and complete course of antibiotics. Return p.r.n.

## 2011-06-23 ENCOUNTER — Other Ambulatory Visit: Payer: Self-pay | Admitting: Oncology

## 2011-06-23 DIAGNOSIS — Z853 Personal history of malignant neoplasm of breast: Secondary | ICD-10-CM

## 2011-07-02 ENCOUNTER — Ambulatory Visit
Admission: RE | Admit: 2011-07-02 | Discharge: 2011-07-02 | Disposition: A | Discharge status: Home / Self Care | Payer: BC Managed Care – PPO | Source: Ambulatory Visit | Attending: Oncology | Admitting: Oncology

## 2011-07-02 DIAGNOSIS — Z853 Personal history of malignant neoplasm of breast: Secondary | ICD-10-CM

## 2011-07-02 MED ORDER — GADOBENATE DIMEGLUMINE 529 MG/ML IV SOLN
20.0000 mL | Freq: Once | INTRAVENOUS | Status: AC | PRN
  Administered 2011-07-02: 20 mL via INTRAVENOUS

## 2011-07-09 ENCOUNTER — Telehealth: Payer: Self-pay

## 2011-07-09 NOTE — Telephone Encounter (Signed)
SPOKE WITH Courtney [name] AT [facility] AND TOLD HER THAT MS. Courtney [name] HAS NOT HAD AN IRON OR FERRITIN LEVEL DONE AT THIS OFFICE.  HER LAST TSH WAS DON October OF 2011.  THIS RECECORDS WERE NOT SENT AS THEY WERE NOT RECENT ENOUGH FOR EVALUATION.

## 2011-07-12 ENCOUNTER — Other Ambulatory Visit: Payer: Self-pay | Admitting: Oncology

## 2011-07-12 ENCOUNTER — Other Ambulatory Visit: Payer: BC Managed Care – PPO

## 2011-07-12 ENCOUNTER — Telehealth: Payer: Self-pay

## 2011-07-12 DIAGNOSIS — R928 Other abnormal and inconclusive findings on diagnostic imaging of breast: Secondary | ICD-10-CM

## 2011-07-12 NOTE — Telephone Encounter (Signed)
SPOKE WITH KATHY AND TOLD HER THAT DR. Darrold Span HAS NOT CALLED PT. WITH THE RESULTS OF BREAST MRI.   THE [facility] CAN CALL PT. TO DISCUSS CALL BACK FOR ADDITIONAL IMAGING. KATHY CAN INPUT ORDERS FOR IMAGING AND SEND A COPY TO DR. Darrold Span TO SIGN AS DONE IN THE PAST.

## 2011-07-13 ENCOUNTER — Telehealth: Payer: Self-pay

## 2011-07-13 ENCOUNTER — Ambulatory Visit
Admission: RE | Admit: 2011-07-13 | Discharge: 2011-07-13 | Disposition: A | Discharge status: Home / Self Care | Payer: BC Managed Care – PPO | Source: Ambulatory Visit | Attending: Oncology | Admitting: Oncology

## 2011-07-13 ENCOUNTER — Other Ambulatory Visit: Payer: Self-pay

## 2011-07-13 ENCOUNTER — Telehealth: Payer: Self-pay | Admitting: Oncology

## 2011-07-13 DIAGNOSIS — R928 Other abnormal and inconclusive findings on diagnostic imaging of breast: Secondary | ICD-10-CM

## 2011-07-13 DIAGNOSIS — N611 Abscess of the breast and nipple: Secondary | ICD-10-CM

## 2011-07-13 MED ORDER — DOXYCYCLINE HYCLATE 100 MG PO TABS: 100.0000 mg | ORAL_TABLET | Freq: Two times a day (BID) | ORAL | Status: DC

## 2011-07-13 NOTE — Telephone Encounter (Signed)
FAXED SIGNED ORDERS DATED 07-13-11 TO [facility] FOR FURTHER IMAGING ON PATIENT.  SENT A COPY OF THE ORDERS TO BE SCANNED INTO PT.'S EMR.

## 2011-07-13 NOTE — Telephone Encounter (Signed)
Imaging at [facility] shows SQ fluid collection on left possibly infected and apparent abscess right inferior breast. Patient would like to see Dr.Gerkin, [facility] to set up that appt. Our office will call in antibiotics; [facility] to speak to patient there and let us know which pharmacy. NKDA per EMR. Will use doxycycline 100 mg bid x 7 days to cover until she gets to surgeon.

## 2011-07-15 ENCOUNTER — Ambulatory Visit (INDEPENDENT_AMBULATORY_CARE_PROVIDER_SITE_OTHER): Payer: BC Managed Care – PPO | Admitting: General Surgery

## 2011-07-15 ENCOUNTER — Encounter (INDEPENDENT_AMBULATORY_CARE_PROVIDER_SITE_OTHER): Payer: Self-pay | Admitting: General Surgery

## 2011-07-15 ENCOUNTER — Telehealth (INDEPENDENT_AMBULATORY_CARE_PROVIDER_SITE_OTHER): Payer: Self-pay | Admitting: General Surgery

## 2011-07-15 VITALS — BP 130/88 | HR 88 | Temp 97.0°F | Resp 16 | Ht 65.0 in | Wt 286.2 lb

## 2011-07-15 DIAGNOSIS — L02219 Cutaneous abscess of trunk, unspecified: Secondary | ICD-10-CM

## 2011-07-15 DIAGNOSIS — L02213 Cutaneous abscess of chest wall: Secondary | ICD-10-CM | POA: Insufficient documentation

## 2011-07-15 NOTE — Patient Instructions (Signed)
Massage infected area on the lower chest wall with warm compresses tonight. Apply dry dressing to the area tonight. Begin warm water showers and cleanses to both left and right areas twice a day tomorrow. Cover with dry gauze. If the area gets worse, go to the emergency room. The area can be drained there under conscious sedation.

## 2011-07-15 NOTE — Telephone Encounter (Signed)
Patient needs a 1wk reck, please call, I did offer to schedule her on tues feb 5, 13, she said that wasn't 1wk, please call.

## 2011-07-16 NOTE — Progress Notes (Signed)
Patient ID: Courtney [name], female   DOB: [birth year], 41 y.o.   MRN: [id number]  Chief Complaint  Patient presents with  . Breast Problem    rt breast infection    HPI Courtney [name] is a 41 y.o. female.   HPI  She is referred by the breast center for bilateral breast abscesses. Actually, she has been treated by [place] for a left inframammary fold chest wall abscess in the past and this is still draining a little bit but is healing. She developed a new right inframammary fold abscess is spontaneously draining now. This is inferior to the site of her TRAM reconstruction following mastectomy and reconstruction for breast cancer in the past. No fever or chills. She is on doxycycline.  Past Medical History  Diagnosis Date  . Abscess of chest wall     left  . Hypertension   . Cancer     right breast    Past Surgical History  Procedure Date  . Breast surgery 2004    mastectomy - right    Family History  Problem Relation Age of Onset  . Cancer Father     prostate  . Heart disease Father   . Cancer Brother     sarcoma  . Cancer Paternal Aunt     breast    Social History History  Substance Use Topics  . Smoking status: Never Smoker   . Smokeless tobacco: Never Used  . Alcohol Use: No    No Known Allergies  Current Outpatient Prescriptions  Medication Sig Dispense Refill  . doxycycline (VIBRA-TABS) 100 MG tablet Take 1 tablet (100 mg total) by mouth 2 (two) times daily.  14 tablet  0  . triamterene-hydrochlorothiazide (MAXZIDE-25) 37.5-25 MG per tablet Daily.        Review of Systems Review of Systems  Constitutional: Negative for fever and chills.    Blood pressure 130/88, pulse 88, temperature 97 F (36.1 C), temperature source Temporal, resp. rate 16, height 5\' 5"  (1.651 m), weight 286 lb 3.2 oz (129.819 kg).  Physical Exam Physical Exam  Constitutional: No distress.       Overweight female.  Pulmonary/Chest:       Right TRAM reconstruction with good  configuration. Inferior to this was a 1.5-2 cm fluctuant area with a central draining area that is nonpurulent and more serous like. Minimal erythema is noted.  Left breast demonstrates scars present in a small healing wound inferior to this with no erythema or induration.    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 She does not want to have any intervention in the office at this time and I don't think she needs any since it is spontaneously draining. I taught her how to apply dry dressings and clean these areas.    Plan    Continue antibiotics. Wound care as instructed. Return visit in one to 2 weeks. If the situation gets worse after intercourse emergency department worked she did have drainage under conscious sedation.       Courtney [name] J 07/15/11  4:45 PM

## 2011-07-26 ENCOUNTER — Ambulatory Visit (INDEPENDENT_AMBULATORY_CARE_PROVIDER_SITE_OTHER): Payer: BC Managed Care – PPO | Admitting: General Surgery

## 2011-07-26 ENCOUNTER — Encounter (INDEPENDENT_AMBULATORY_CARE_PROVIDER_SITE_OTHER): Payer: Self-pay | Admitting: General Surgery

## 2011-07-26 VITALS — HR 72 | Temp 97.6°F | Resp 16 | Ht 65.0 in | Wt 274.0 lb

## 2011-07-26 DIAGNOSIS — L02213 Cutaneous abscess of chest wall: Secondary | ICD-10-CM

## 2011-07-26 DIAGNOSIS — N61 Mastitis without abscess: Secondary | ICD-10-CM

## 2011-07-26 DIAGNOSIS — L02219 Cutaneous abscess of trunk, unspecified: Secondary | ICD-10-CM

## 2011-07-26 DIAGNOSIS — N611 Abscess of the breast and nipple: Secondary | ICD-10-CM

## 2011-07-26 MED ORDER — DOXYCYCLINE HYCLATE 100 MG PO TABS: 100.0000 mg | ORAL_TABLET | Freq: Two times a day (BID) | ORAL | Status: DC

## 2011-07-26 NOTE — Patient Instructions (Signed)
Keep a dry bandage on the areas.  Continue care of the areas as instructed.

## 2011-07-26 NOTE — Progress Notes (Signed)
She returns for followup of her bilateral inframammary abscesses. She had some drainage of tissue recently and this is alleviated her pain on the right side. Left side is not draining much if any at all. She has been on doxycycline.  Exam: Left inframammary area demonstrates a closed wound with no induration or cellulitis. Right inframammary area demonstrates a small punctate open wound little induration medial to this at minimal tenderness and no purulent drainage and no erythema.  Assessment: Bilateral inframammary abscesses that are responding to current treatment  Plan:  Continue current wound care. I wanted to be on doxycycline for 3 more weeks and we'll see her back at that time.

## 2011-07-28 ENCOUNTER — Encounter (INDEPENDENT_AMBULATORY_CARE_PROVIDER_SITE_OTHER): Payer: BC Managed Care – PPO | Admitting: Surgery

## 2011-08-04 ENCOUNTER — Ambulatory Visit (INDEPENDENT_AMBULATORY_CARE_PROVIDER_SITE_OTHER): Payer: BC Managed Care – PPO | Admitting: Surgery

## 2011-08-04 ENCOUNTER — Encounter (INDEPENDENT_AMBULATORY_CARE_PROVIDER_SITE_OTHER): Payer: BC Managed Care – PPO | Admitting: Surgery

## 2011-08-16 ENCOUNTER — Encounter (INDEPENDENT_AMBULATORY_CARE_PROVIDER_SITE_OTHER): Payer: BC Managed Care – PPO | Admitting: Surgery

## 2011-08-19 ENCOUNTER — Ambulatory Visit (INDEPENDENT_AMBULATORY_CARE_PROVIDER_SITE_OTHER): Payer: BC Managed Care – PPO | Admitting: General Surgery

## 2011-08-19 ENCOUNTER — Encounter (INDEPENDENT_AMBULATORY_CARE_PROVIDER_SITE_OTHER): Payer: Self-pay | Admitting: General Surgery

## 2011-08-19 VITALS — BP 146/94 | HR 92 | Temp 97.8°F | Resp 16 | Ht 64.0 in | Wt 296.0 lb

## 2011-08-19 DIAGNOSIS — Z09 Encounter for follow-up examination after completed treatment for conditions other than malignant neoplasm: Secondary | ICD-10-CM

## 2011-08-19 NOTE — Progress Notes (Signed)
She returns for followup of her bilateral inframammary abscesses.   She feels they are healed.  Exam: Both inframammary wounds are healed and without evidence of recurrent infection.  Assessment: Bilateral inframammary abscesses- resolved.  Plan: Keep the areas dry.  Avoid bras that traumatize the areas.  RTC prn.

## 2011-08-19 NOTE — Patient Instructions (Signed)
Avoid bras that may cut into your skin.  Keep a dry pad under your breasts for one month.

## 2011-09-10 ENCOUNTER — Telehealth: Payer: Self-pay

## 2011-09-10 NOTE — Telephone Encounter (Signed)
FAXED SIGNED DISPENSING ORDER FOR MASTECTOMY SUPPLIES DATED 09-09-11. SENT A COPY TO MEDICAL RECORDS TO BE SCANNED INTO PT.'S EMR.

## 2011-12-24 ENCOUNTER — Telehealth: Payer: Self-pay

## 2011-12-24 NOTE — Telephone Encounter (Signed)
Faxed signed order dated 12-24-11 for breast prosthesis.  Sent a copy to medical records  To be scanned into pt.'s emr.

## 2012-03-09 ENCOUNTER — Telehealth: Payer: Self-pay | Admitting: Oncology

## 2012-03-09 ENCOUNTER — Other Ambulatory Visit: Payer: Self-pay | Admitting: Oncology

## 2012-03-09 DIAGNOSIS — Z853 Personal history of malignant neoplasm of breast: Secondary | ICD-10-CM

## 2012-03-09 NOTE — Telephone Encounter (Signed)
S/w pt re 10/23 f/u and 12/1 mammo/us. Pt aware [facility] will call re breast mri appt to be done within 3mos of mammo. lmonvm for Cathy @ [facility] re mri appt and faxed order [facility]).

## 2012-04-05 ENCOUNTER — Ambulatory Visit (HOSPITAL_BASED_OUTPATIENT_CLINIC_OR_DEPARTMENT_OTHER): Payer: BC Managed Care – PPO | Admitting: Oncology

## 2012-04-05 ENCOUNTER — Encounter: Payer: Self-pay | Admitting: Oncology

## 2012-04-05 ENCOUNTER — Telehealth: Payer: Self-pay | Admitting: Oncology

## 2012-04-05 VITALS — BP 169/101 | HR 94 | Temp 98.4°F | Resp 22 | Ht 64.0 in | Wt 307.8 lb

## 2012-04-05 DIAGNOSIS — D0510 Intraductal carcinoma in situ of unspecified breast: Secondary | ICD-10-CM

## 2012-04-05 DIAGNOSIS — D0590 Unspecified type of carcinoma in situ of unspecified breast: Secondary | ICD-10-CM

## 2012-04-05 NOTE — Telephone Encounter (Signed)
Gave pt appt for Mammogram, U/S and MD visit for 2014
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
## 2012-04-05 NOTE — Patient Instructions (Signed)
Call if you do not get report of the mammograms in Dec  [facility], 4000 steps daily

## 2012-04-06 ENCOUNTER — Telehealth: Payer: Self-pay | Admitting: *Deleted

## 2012-04-06 NOTE — Telephone Encounter (Signed)
Spoke with patient regarding 3D mammogram. Pt does want to have 3D mammogram in December.

## 2012-04-07 ENCOUNTER — Other Ambulatory Visit: Payer: Self-pay | Admitting: Oncology

## 2012-04-07 ENCOUNTER — Telehealth: Payer: Self-pay | Admitting: Oncology

## 2012-04-07 DIAGNOSIS — Z1231 Encounter for screening mammogram for malignant neoplasm of breast: Secondary | ICD-10-CM

## 2012-04-07 NOTE — Telephone Encounter (Signed)
called and lm with 3d mammo appt(per the bc you couldnt do a 3d diag mammo and u/s that it has to be a screening only-no u/s    aom

## 2012-05-15 ENCOUNTER — Other Ambulatory Visit: Payer: BC Managed Care – PPO

## 2012-05-18 ENCOUNTER — Ambulatory Visit
Admission: RE | Admit: 2012-05-18 | Discharge: 2012-05-18 | Disposition: A | Discharge status: Home / Self Care | Payer: BC Managed Care – PPO | Source: Ambulatory Visit | Attending: Oncology | Admitting: Oncology

## 2012-05-18 DIAGNOSIS — Z1231 Encounter for screening mammogram for malignant neoplasm of breast: Secondary | ICD-10-CM

## 2012-05-24 ENCOUNTER — Other Ambulatory Visit: Payer: Self-pay | Admitting: Oncology

## 2012-05-24 DIAGNOSIS — R928 Other abnormal and inconclusive findings on diagnostic imaging of breast: Secondary | ICD-10-CM

## 2012-06-02 ENCOUNTER — Ambulatory Visit
Admission: RE | Admit: 2012-06-02 | Discharge: 2012-06-02 | Disposition: A | Discharge status: Home / Self Care | Payer: BC Managed Care – PPO | Source: Ambulatory Visit | Attending: Oncology | Admitting: Oncology

## 2012-06-02 DIAGNOSIS — R928 Other abnormal and inconclusive findings on diagnostic imaging of breast: Secondary | ICD-10-CM

## 2012-06-09 ENCOUNTER — Other Ambulatory Visit: Payer: Self-pay | Admitting: Oncology

## 2012-06-09 DIAGNOSIS — Z9010 Acquired absence of unspecified breast and nipple: Secondary | ICD-10-CM

## 2012-06-09 DIAGNOSIS — Z853 Personal history of malignant neoplasm of breast: Secondary | ICD-10-CM

## 2012-06-19 ENCOUNTER — Other Ambulatory Visit: Payer: Self-pay | Admitting: *Deleted

## 2012-06-19 ENCOUNTER — Telehealth: Payer: Self-pay | Admitting: *Deleted

## 2012-06-19 DIAGNOSIS — D0510 Intraductal carcinoma in situ of unspecified breast: Secondary | ICD-10-CM

## 2012-06-19 MED ORDER — ALPRAZOLAM 0.25 MG PO TABS: ORAL_TABLET | ORAL | Status: DC

## 2012-06-19 NOTE — Telephone Encounter (Signed)
Pt left a voice mail stating she is scheduled for an MRI on Wednesday. MRI will need an order for med to "calm her down".

## 2012-06-19 NOTE — Telephone Encounter (Signed)
Spoke with patient. Dr Darrold Span ordered Xanax for her to take prior to MRI as prescribed

## 2012-06-21 ENCOUNTER — Ambulatory Visit
Admission: RE | Admit: 2012-06-21 | Discharge: 2012-06-21 | Disposition: A | Discharge status: Home / Self Care | Payer: BC Managed Care – PPO | Source: Ambulatory Visit | Attending: Oncology | Admitting: Oncology

## 2012-06-21 DIAGNOSIS — Z853 Personal history of malignant neoplasm of breast: Secondary | ICD-10-CM

## 2012-06-21 DIAGNOSIS — Z9010 Acquired absence of unspecified breast and nipple: Secondary | ICD-10-CM

## 2012-06-21 MED ORDER — GADOBENATE DIMEGLUMINE 529 MG/ML IV SOLN
20.0000 mL | Freq: Once | INTRAVENOUS | Status: AC | PRN
  Administered 2012-06-21: 20 mL via INTRAVENOUS

## 2012-06-26 ENCOUNTER — Telehealth: Payer: Self-pay

## 2012-06-26 NOTE — Telephone Encounter (Signed)
Message copied by Lorine Bears on Mon Jun 26, 2012  9:13 AM ------      Message from: Heath, Juanita Craver      Created: Sun Jun 25, 2012  9:38 AM       Labs seen and need follow up: please let her know I got report of the breast MRI, which does not show anything that looks suspicious. The area in the left breast looks like a normal lymph node on this MRI also.

## 2012-06-26 NOTE — Telephone Encounter (Signed)
Told patient the results of breast mri as noted below by Dr. Darrold Span.

## 2012-09-29 ENCOUNTER — Telehealth: Payer: Self-pay

## 2012-09-29 NOTE — Telephone Encounter (Signed)
Faxed signed order dated 09-29-12 for breast prothesis and mastectomy bras to [facility]. Sent a copy of signed order to HIM to be scanned into patient's EMR.

## 2012-11-09 ENCOUNTER — Telehealth: Payer: Self-pay

## 2012-11-09 NOTE — Telephone Encounter (Signed)
Faxed signed orders for mastectomy bras dated 11-09-12. Sent a copy to HIM to be scanned into patient's EMR.

## 2012-12-25 ENCOUNTER — Telehealth: Payer: Self-pay

## 2012-12-25 NOTE — Telephone Encounter (Signed)
Faxed signed orders for mastectomy supplies by Dr. Eli Phillips Dated 12-25-12 to [facility]. Sent a copy of signed order to HIM to be scanned into patient's EMR.

## 2013-01-21 ENCOUNTER — Emergency Department (HOSPITAL_COMMUNITY)
Admission: EM | Admit: 2013-01-21 | Discharge: 2013-01-21 | Disposition: A | Discharge status: Home / Self Care | Payer: BC Managed Care – PPO | Attending: Emergency Medicine | Admitting: Emergency Medicine

## 2013-01-21 ENCOUNTER — Encounter (HOSPITAL_COMMUNITY): Payer: Self-pay

## 2013-01-21 DIAGNOSIS — Z79899 Other long term (current) drug therapy: Secondary | ICD-10-CM | POA: Insufficient documentation

## 2013-01-21 DIAGNOSIS — I10 Essential (primary) hypertension: Secondary | ICD-10-CM | POA: Insufficient documentation

## 2013-01-21 DIAGNOSIS — Z853 Personal history of malignant neoplasm of breast: Secondary | ICD-10-CM | POA: Insufficient documentation

## 2013-01-21 DIAGNOSIS — R220 Localized swelling, mass and lump, head: Secondary | ICD-10-CM | POA: Insufficient documentation

## 2013-01-21 DIAGNOSIS — Z872 Personal history of diseases of the skin and subcutaneous tissue: Secondary | ICD-10-CM | POA: Insufficient documentation

## 2013-01-21 DIAGNOSIS — K047 Periapical abscess without sinus: Secondary | ICD-10-CM

## 2013-01-21 HISTORY — DX: Other specified disorders of teeth and supporting structures: K08.89

## 2013-01-21 MED ORDER — CLINDAMYCIN HCL 300 MG PO CAPS
300.0000 mg | ORAL_CAPSULE | Freq: Once | ORAL | Status: AC
  Administered 2013-01-21: 300 mg via ORAL
  Filled 2013-01-21: qty 1

## 2013-01-21 MED ORDER — OXYCODONE-ACETAMINOPHEN 5-325 MG PO TABS: 1.0000 | ORAL_TABLET | ORAL | Status: DC | PRN

## 2013-01-21 MED ORDER — OXYCODONE-ACETAMINOPHEN 5-325 MG PO TABS
1.0000 | ORAL_TABLET | Freq: Once | ORAL | Status: AC
  Administered 2013-01-21: 1 via ORAL
  Filled 2013-01-21: qty 1

## 2013-01-21 MED ORDER — CLINDAMYCIN HCL 150 MG PO CAPS: 300.0000 mg | ORAL_CAPSULE | Freq: Three times a day (TID) | ORAL | Status: DC

## 2013-01-21 NOTE — ED Provider Notes (Signed)
History  This chart was scribed for non-physician practitioner, Kyung Bacca PA-C, working with Derwood Kaplan, MD by Ardeen Jourdain, ED Scribe. This patient was seen in room WTR8/WTR8 and the patient's care was started at 2127.  CSN: [id number]     Arrival date & time 01/21/13  2051  First MD Initiated Contact with Patient 01/21/13 2127     Chief Complaint  Patient presents with  . Dental Pain    The history is provided by the patient. No language interpreter was used.   HPI Comments: Courtney [name] is a 42 y.o. female who presents to the Emergency Department complaining of gradual onset, gradually worsening, constant dental pain that began 4 days ago. She states she woke up this morning with left sided facial swelling. She states she broke the tooth "a while ago." She denies any pain previous to the current pain. She states her dentist is out of town all week so she has not been able to be seen. She denies any nausea, emesis, trouble breathing or trouble swallowing as associated symptoms. She reports taking flexeril and Excedrin with no relief.   Past Medical History  Diagnosis Date  . Abscess of chest wall     left  . Hypertension   . Cancer     right breast  . Pain, dental    Past Surgical History  Procedure Laterality Date  . Breast surgery  2004    mastectomy - right  . Breast reduction surgery    . Incise and drain abcess      left breast   Family History  Problem Relation Age of Onset  . Cancer Father     prostate  . Heart disease Father   . Cancer Brother     sarcoma  . Cancer Paternal Aunt     breast   History  Substance Use Topics  . Smoking status: Never Smoker   . Smokeless tobacco: Never Used  . Alcohol Use: No   No OB history available.   Review of Systems  All other systems reviewed and are negative.    Allergies  Review of patient's allergies indicates no known allergies.  Home Medications   Current Outpatient Rx  Name  Route  Sig   Dispense  Refill  . Calcium 500 MG tablet   Oral   Take 500 mg by mouth 2 (two) times daily.          . cyclobenzaprine (FLEXERIL) 10 MG tablet   Oral   Take 10 mg by mouth 3 (three) times daily as needed for muscle spasms.         Marland Kitchen lisinopril (PRINIVIL,ZESTRIL) 20 MG tablet   Oral   Take 20 mg by mouth daily.         . Multiple Vitamins-Iron (MULTIVITAMIN/IRON PO)   Oral   Take 1 tablet by mouth daily.         . Omega-3 Fatty Acids (FISH OIL PO)   Oral   Take 1 capsule by mouth daily.          Triage Vitals: BP 170/110  Pulse 102  Temp(Src) 99.7 F (37.6 C) (Oral)  Resp 18  Ht 5' 5.5" (1.664 m)  Wt 299 lb (135.626 kg)  BMI 48.98 kg/m2  SpO2 100%  LMP 01/15/2013  Physical Exam  Nursing note and vitals reviewed. Constitutional: She is oriented to person, place, and time. She appears well-developed and well-nourished.  HENT:  Head: Normocephalic and atraumatic. No trismus in  the jaw.  Mouth/Throat: Uvula is midline and oropharynx is clear and moist.  Left upper canine w/ Rennis Harding type 2 fracture posteriorly.  Advanced carie.  Ttp.  Adjacent gingiva appears normal.  Mild edema and induration left upper lip that extends up along left side of nose.    Eyes:  Normal appearance  Neck: Normal range of motion. Neck supple.  No submandibular edema  Lymphadenopathy:    She has no cervical adenopathy.  Neurological: She is alert and oriented to person, place, and time.  Psychiatric: She has a normal mood and affect. Her behavior is normal.    ED Course   Procedures (including critical care time)  DIAGNOSTIC STUDIES: Oxygen Saturation is 100% on room air, normal by my interpretation.    COORDINATION OF CARE:  9:53 PM-Discussed treatment plan which includes antibiotics and pain medication with pt at bedside and pt agreed to plan.    Labs Reviewed - No data to display No results found. 1. Periapical abscess     MDM  42yo F presents w/ dental pain w/  associated facial edema/induration.  Her dentist out of town for week.  Exam consistent w/ periapical abscess L upper canine.  Pt received first dose of clinda and percocet in ED and sent home w/ same + referral to dentist on call.  Return precautions discussed.   I personally performed the services described in this documentation, which was scribed in my presence. The recorded information has been reviewed and is accurate.    Otilio Miu, PA-C 01/22/13 2116

## 2013-01-21 NOTE — ED Notes (Signed)
Dental pain since Thursday facial swelling today left side

## 2013-01-25 NOTE — ED Provider Notes (Signed)
Medical screening examination/treatment/procedure(s) were performed by non-physician practitioner and as supervising physician I was immediately available for consultation/collaboration.    Lealand Elting D Marien Manship, MD 01/25/13 0813 

## 2013-03-21 ENCOUNTER — Telehealth: Payer: Self-pay | Admitting: Oncology

## 2013-03-21 NOTE — Telephone Encounter (Signed)
returned pt call and and advised on appt changed to Dr. Cleophas Dunker...pt ok and aware

## 2013-04-04 ENCOUNTER — Ambulatory Visit (HOSPITAL_BASED_OUTPATIENT_CLINIC_OR_DEPARTMENT_OTHER): Payer: BC Managed Care – PPO | Admitting: Lab

## 2013-04-04 ENCOUNTER — Telehealth: Payer: Self-pay | Admitting: Oncology

## 2013-04-04 ENCOUNTER — Ambulatory Visit (HOSPITAL_BASED_OUTPATIENT_CLINIC_OR_DEPARTMENT_OTHER): Payer: BC Managed Care – PPO | Admitting: Oncology

## 2013-04-04 ENCOUNTER — Ambulatory Visit: Payer: BC Managed Care – PPO

## 2013-04-04 ENCOUNTER — Encounter: Payer: Self-pay | Admitting: Oncology

## 2013-04-04 VITALS — BP 163/95 | HR 102 | Temp 98.3°F | Resp 20 | Ht 65.5 in | Wt 305.7 lb

## 2013-04-04 DIAGNOSIS — Z87898 Personal history of other specified conditions: Secondary | ICD-10-CM

## 2013-04-04 DIAGNOSIS — N920 Excessive and frequent menstruation with regular cycle: Secondary | ICD-10-CM

## 2013-04-04 DIAGNOSIS — Z1231 Encounter for screening mammogram for malignant neoplasm of breast: Secondary | ICD-10-CM

## 2013-04-04 DIAGNOSIS — E669 Obesity, unspecified: Secondary | ICD-10-CM

## 2013-04-04 LAB — CBC WITH DIFFERENTIAL/PLATELET
BASO%: 0.5 % (ref 0.0–2.0)
EOS%: 7.7 % — ABNORMAL HIGH (ref 0.0–7.0)
HCT: 32.9 % — ABNORMAL LOW (ref 34.8–46.6)
LYMPH%: 33.9 % (ref 14.0–49.7)
MCH: 29.3 pg (ref 25.1–34.0)
MCHC: 33.1 g/dL (ref 31.5–36.0)
NEUT%: 49.5 % (ref 38.4–76.8)
Platelets: 341 10*3/uL (ref 145–400)
RBC: 3.71 10*6/uL (ref 3.70–5.45)
lymph#: 3.1 10*3/uL (ref 0.9–3.3)

## 2013-04-04 NOTE — Patient Instructions (Signed)
Resume daily exercise -- walking would be great!

## 2013-04-04 NOTE — Telephone Encounter (Signed)
, °

## 2013-04-04 NOTE — Progress Notes (Signed)
OFFICE PROGRESS NOTE   04/04/2013   Physicians:E.Griffin, J.Mann, L.Lomax, V. Mody    INTERVAL HISTORY:  Patient is seen, alone for visit, in yearly follow up of her history of DCIS right breast, on observation since she completed 5 years of tamoxifen in 2009. Last bilateral tomo mammography was at [facility] 05-23-12, with follow up left diagnostic mammogram and [place] at [facility] 06-02-12 and MRI breast 06-25-12 which confirmed benign appearing lymph node 6 x 8 mm left posterior central breast.  Patient is now followed at [facility] by Dr Shea Evans. She has long history of irregular menses which preceded the tamoxifen treatment. Per history from patient now, she has had heavy vaginal bleeding from July until last week, after no significant bleeding for months. This bleeding did not respond to medication by Dr Juliene Pina, who has recommended Mirena IUD.    ONCOLOGIC HISTORY Patient was unexpectedly found to have ER + DCIS right breast at reduction mammoplasty in Jan 2004, at age 45. She had right mastectomy with TRAM followed by five years of tamoxifen from May 2004 thru 2009.   Review of systems as above, also: No recurrence of superficial abscess beneath TRAM and left breast since Dr Abbey Chatters treated the areas in early 2013. Plantar fascitis symptoms much improved. Some soreness shins bilaterally with walking. No regular exercise, tho she did [employer] Walk last week, very fatigued by completion. Not following diet. No other bleeding. No recent infectious illness. No noted changes in left breast or TRAM. Remainder of 10 point Review of Systems negative.  Objective:  Vital signs in last 24 hours:  BP 163/95  Pulse 102  Temp(Src) 98.3 F (36.8 C) (Oral)  Resp 20  Ht 5' 5.5" (1.664 m)  Wt 305 lb 11.2 oz (138.665 kg)  BMI 50.08 kg/m2  Weight is down 2 lbs from a year ago Alert, oriented and appropriate. Ambulatory without difficulty.  Obese.  HEENT: prosthetic eye, not icteric.  Oral mucosa moist without lesions, posterior pharynx clear.  Neck supple. No JVD.  Lymphatics:no cervical,suraclavicular, axillary or inguinal adenopathy Resp: clear to auscultation bilaterally and normal percussion bilaterally Cardio: regular rate and rhythm. No gallop. GI: soft, nontender, not distended, no appreciable mass or organomegaly. Normally active bowel sounds.  Musculoskeletal/ Extremities: without pitting edema, cords, tenderness. Back not tender Neuro:  nonfocal Skin without rash, ecchymosis, petechiae. No superficial abscess beneath breast. Breasts: Right TRAM not remarkable. Left with well healed scars from reduction mammoplasty, without dominant mass, skin or nipple findings. Axillae benign.   Lab Results:  CBC obtained after visit due to recent prolonged gyn bleeding, with WBC 9.0, ANC 4.5, Hgb 10.9, MC 88 and plt 341.  Last CBC in this EMR from 03-2010 had Hgb 12.  Studies/Results:  No results found. Breast imaging from 05-2012 reviewed.  Medications: I have reviewed the patient's current medications. We will be back in touch with her to recommend oral ferrous fumarate for ~ one month.   DISCUSSION: I have told patient that I am comfortable with Dr Camillia Herter recommendation for Mirena IUD, and will be in touch with her via this note. We have discussed importance of weight loss to ideal from standpoint of cancer risk. I have encouraged her to resume regular walking for exercise daily.  Prior to patient's visit, genetics counselor at [facility] reviewed her previous genetics workup at my request, negative BRCA 1/2 in 2010 but other testing possibly warranted. I have discussed with patient now and she is in agreement with talking  further with genetics counselor.  Assessment/Plan: 1. DCIS right breast: history as above. Needs weight loss to ideal. Will get mammograms Dec 2014. Genetics counseling as above. 2.recent heavy, prolonged uterine bleeding, with long history of irregular  menses and past tamoxifen therapy. I do not have gyn notes, however Dr Juliene Pina is aware of situation and patient will be back in touch with her re Mirena IUD. 3.morbid obesity as above 4.up to date on colonoscopy by Dr Loreta Ave, for chronic constipation 5.prosthetic eye  I will continue to see her yearly and prn. Patient understood discussion and is in agreement with plan.      Reece Packer, MD   04/04/2013, 8:13 PM

## 2013-04-05 ENCOUNTER — Telehealth: Payer: Self-pay

## 2013-04-05 DIAGNOSIS — D649 Anemia, unspecified: Secondary | ICD-10-CM

## 2013-04-05 MED ORDER — HEMOCYTE 324 (106 FE) MG PO TABS: ORAL_TABLET | ORAL | Status: DC

## 2013-04-05 NOTE — Telephone Encounter (Signed)
Message copied by Lorine Bears on Thu Apr 05, 2013  2:06 PM ------      Message from: Jama Flavors P      Created: Thu Apr 05, 2013 10:01 AM       Labs seen and need follow up: please let her know she is slightly anemic from the heavy gyn bleeding, at 10.9 by lab 10-22 ( had been ~ 12 when checked at our lab in 2011). Suggest ferrous fumarate DAW ~ 325 mg on empty stomach with OJ or Vit C tablet daily or several times weekly as she is able to tolerate, tho hopefully the ferrous fumarate will not give GI symptoms. If her insurance covers Hemocyte, that would be fine, #30 enough either way.            Cc LA, TH ------

## 2013-04-05 NOTE — Telephone Encounter (Signed)
Discussed decreased Hgb on yesterday's lab as noted below by Dr. Darrold Span.  Called in Iron as noted below for patient.

## 2013-05-05 ENCOUNTER — Emergency Department (HOSPITAL_COMMUNITY): Payer: BC Managed Care – PPO

## 2013-05-05 ENCOUNTER — Observation Stay (HOSPITAL_COMMUNITY): Payer: BC Managed Care – PPO

## 2013-05-05 ENCOUNTER — Observation Stay (HOSPITAL_COMMUNITY)
Admission: EM | Admit: 2013-05-05 | Discharge: 2013-05-09 | Disposition: A | Discharge status: Home / Self Care | Payer: BC Managed Care – PPO | Attending: Orthopedic Surgery | Admitting: Orthopedic Surgery

## 2013-05-05 DIAGNOSIS — L02219 Cutaneous abscess of trunk, unspecified: Secondary | ICD-10-CM | POA: Insufficient documentation

## 2013-05-05 DIAGNOSIS — Z411 Encounter for cosmetic surgery: Secondary | ICD-10-CM | POA: Insufficient documentation

## 2013-05-05 DIAGNOSIS — S82142A Displaced bicondylar fracture of left tibia, initial encounter for closed fracture: Secondary | ICD-10-CM

## 2013-05-05 DIAGNOSIS — S82109A Unspecified fracture of upper end of unspecified tibia, initial encounter for closed fracture: Principal | ICD-10-CM | POA: Insufficient documentation

## 2013-05-05 DIAGNOSIS — M25529 Pain in unspecified elbow: Secondary | ICD-10-CM | POA: Insufficient documentation

## 2013-05-05 DIAGNOSIS — S82143A Displaced bicondylar fracture of unspecified tibia, initial encounter for closed fracture: Secondary | ICD-10-CM

## 2013-05-05 DIAGNOSIS — IMO0002 Reserved for concepts with insufficient information to code with codable children: Secondary | ICD-10-CM | POA: Insufficient documentation

## 2013-05-05 DIAGNOSIS — Z9010 Acquired absence of unspecified breast and nipple: Secondary | ICD-10-CM | POA: Insufficient documentation

## 2013-05-05 DIAGNOSIS — I10 Essential (primary) hypertension: Secondary | ICD-10-CM | POA: Insufficient documentation

## 2013-05-05 LAB — POCT I-STAT, CHEM 8
Calcium, Ion: 1.2 mmol/L (ref 1.12–1.23)
Chloride: 104 mEq/L (ref 96–112)
Creatinine, Ser: 1.2 mg/dL — ABNORMAL HIGH (ref 0.50–1.10)
Glucose, Bld: 137 mg/dL — ABNORMAL HIGH (ref 70–99)
HCT: 39 % (ref 36.0–46.0)
Hemoglobin: 13.3 g/dL (ref 12.0–15.0)
Potassium: 4.3 mEq/L (ref 3.5–5.1)

## 2013-05-05 LAB — CBC WITH DIFFERENTIAL/PLATELET
Basophils Absolute: 0 10*3/uL (ref 0.0–0.1)
Basophils Relative: 0 % (ref 0–1)
Eosinophils Relative: 0 % (ref 0–5)
HCT: 35.6 % — ABNORMAL LOW (ref 36.0–46.0)
Hemoglobin: 11.8 g/dL — ABNORMAL LOW (ref 12.0–15.0)
Lymphs Abs: 2 10*3/uL (ref 0.7–4.0)
MCH: 29.9 pg (ref 26.0–34.0)
MCHC: 33.1 g/dL (ref 30.0–36.0)
Monocytes Absolute: 1.1 10*3/uL — ABNORMAL HIGH (ref 0.1–1.0)
Monocytes Relative: 6 % (ref 3–12)
Neutro Abs: 14.3 10*3/uL — ABNORMAL HIGH (ref 1.7–7.7)
Neutrophils Relative %: 82 % — ABNORMAL HIGH (ref 43–77)
RBC: 3.94 MIL/uL (ref 3.87–5.11)

## 2013-05-05 MED ORDER — ONDANSETRON HCL 4 MG/2ML IJ SOLN: 4.0000 mg | Freq: Four times a day (QID) | INTRAMUSCULAR | Status: DC | PRN

## 2013-05-05 MED ORDER — MORPHINE SULFATE 2 MG/ML IJ SOLN
1.0000 mg | INTRAMUSCULAR | Status: DC | PRN
  Administered 2013-05-06: 1 mg via INTRAVENOUS
  Filled 2013-05-05: qty 1

## 2013-05-05 MED ORDER — ASPIRIN EC 325 MG PO TBEC
325.0000 mg | DELAYED_RELEASE_TABLET | Freq: Two times a day (BID) | ORAL | Status: DC
  Administered 2013-05-05 – 2013-05-07 (×4): 325 mg via ORAL
  Filled 2013-05-05 (×7): qty 1

## 2013-05-05 MED ORDER — HYDROCODONE-ACETAMINOPHEN 5-325 MG PO TABS
1.0000 | ORAL_TABLET | ORAL | Status: DC | PRN
  Administered 2013-05-06 – 2013-05-09 (×13): 2 via ORAL
  Filled 2013-05-05 (×13): qty 2

## 2013-05-05 MED ORDER — ONDANSETRON HCL 4 MG/2ML IJ SOLN
4.0000 mg | Freq: Once | INTRAMUSCULAR | Status: AC
  Administered 2013-05-05: 4 mg via INTRAVENOUS
  Filled 2013-05-05: qty 2

## 2013-05-05 MED ORDER — OXYCODONE HCL 5 MG PO TABS
5.0000 mg | ORAL_TABLET | ORAL | Status: DC | PRN
  Administered 2013-05-08: 5 mg via ORAL
  Filled 2013-05-05: qty 1

## 2013-05-05 MED ORDER — ONDANSETRON HCL 4 MG PO TABS: 4.0000 mg | ORAL_TABLET | Freq: Four times a day (QID) | ORAL | Status: DC | PRN

## 2013-05-05 MED ORDER — OXYCODONE-ACETAMINOPHEN 5-325 MG PO TABS
2.0000 | ORAL_TABLET | Freq: Once | ORAL | Status: AC
  Administered 2013-05-05: 2 via ORAL
  Filled 2013-05-05: qty 2

## 2013-05-05 NOTE — ED Notes (Signed)
Patient's left hand and fingers swollen.  Wedding band and diamond ring cut off due to swelling.  Rings given to family members.

## 2013-05-05 NOTE — Progress Notes (Signed)
Orthopedic Tech Progress Note Patient Details:  Courtney [name] 04/27/1971 [id number]  Patient ID: Courtney [name], female   DOB: 1970/12/04, 42 y.o.   MRN: [id number] Courtney [name] 05/05/2013, 11:45 PM
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
## 2013-05-05 NOTE — Progress Notes (Signed)
Orthopedic Tech Progress Note Patient Details:  Courtney [name] 15-Dec-1970 [id number]  Ortho Devices Type of Ortho Device: Ace wrap;Knee Immobilizer;Finger splint Ortho Device/Splint Location: lue/lle Ortho Device/Splint Interventions: Application   [name] 05/05/2013, 11:37 PM
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
## 2013-05-06 ENCOUNTER — Observation Stay (HOSPITAL_COMMUNITY): Payer: BC Managed Care – PPO

## 2013-05-06 ENCOUNTER — Encounter (HOSPITAL_COMMUNITY): Payer: Self-pay | Admitting: *Deleted

## 2013-05-06 DIAGNOSIS — S82109A Unspecified fracture of upper end of unspecified tibia, initial encounter for closed fracture: Secondary | ICD-10-CM | POA: Diagnosis not present

## 2013-05-06 MED ORDER — WHITE PETROLATUM GEL
Status: AC
  Administered 2013-05-06: 0.2
  Filled 2013-05-06: qty 5

## 2013-05-06 NOTE — Progress Notes (Signed)
PATIENT ID: Courtney [name]        Subjective: Pain is worse in the left leg and knee. Currently fairly well-controlled at 3/10. Left shoulder also bothering her more this morning.  Objective:  Filed Vitals:   05/06/13 0745  BP: 107/57  Pulse: 88  Temp: 98.5 F (36.9 C)  Resp: 16     Left lower extremity wrapped in an Ace bandage. Compartment soft and compressible. No pain with passive stretch. Distally neurovascularly intact. Left hand splint adjusted. Fingers buddy taped. Left shoulder with tenderness posteriorly over the scapula.  Labs:   Recent Labs  05/05/13 2117 05/05/13 2130  HGB 11.8* 13.3   Recent Labs  05/05/13 2117 05/05/13 2130  WBC 17.4*  --   RBC 3.94  --   HCT 35.6* 39.0  PLT 329  --    Recent Labs  05/05/13 2130  NA 140  K 4.3  CL 104  BUN 18  CREATININE 1.20*  GLUCOSE 137*    Assessment and Plan: #1 left comminuted depressed lateral tibial plateau fracture: Continue compression dressing and knee immobilizer. Will need surgical fixation. Will discuss with Dr. handy. For now she will be nonweightbearing. Start physical therapy today. #2 left small finger P1 fracture: Continue splint and buddy taping #3 left shoulder and elbow pain: Will check left shoulder x-rays today.  VTE proph: SCDs and aspirin twice daily

## 2013-05-06 NOTE — H&P (Signed)
Agree with above.  Knee immobilizer and finger splint for now.  Will start to try and mobilize with PT.  Pain control.  Will d/w Dr. Carola Frost re: timing of fixation for her comminuted tibial plateau fracture.
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
## 2013-05-07 ENCOUNTER — Observation Stay (HOSPITAL_COMMUNITY): Payer: BC Managed Care – PPO

## 2013-05-07 DIAGNOSIS — S82109A Unspecified fracture of upper end of unspecified tibia, initial encounter for closed fracture: Secondary | ICD-10-CM | POA: Diagnosis not present

## 2013-05-07 MED ORDER — ENOXAPARIN (LOVENOX) PATIENT EDUCATION KIT: PACK | Freq: Once | Status: DC

## 2013-05-07 MED ORDER — LISINOPRIL 20 MG PO TABS
20.0000 mg | ORAL_TABLET | Freq: Every day | ORAL | Status: DC
  Administered 2013-05-07 – 2013-05-09 (×3): 20 mg via ORAL
  Filled 2013-05-07 (×4): qty 1

## 2013-05-07 MED ORDER — ENOXAPARIN SODIUM 40 MG/0.4ML ~~LOC~~ SOLN
40.0000 mg | SUBCUTANEOUS | Status: DC
  Administered 2013-05-08 – 2013-05-09 (×2): 40 mg via SUBCUTANEOUS
  Filled 2013-05-07 (×4): qty 0.4

## 2013-05-07 NOTE — Progress Notes (Signed)
PATIENT ID: Courtney [name]        Subjective: no new complaints.  Eager to get out of bed.  Objective:  Filed Vitals:   05/07/13 0619  BP: 117/75  Pulse: 85  Temp: 97.9 F (36.6 C)  Resp: 17     Left lower extremity knee immobilizer intact.  wiggles toes and ankles up and down.  Compartment soft.  No pain with passive stretch.she does have some tenderness to palpation over the lateral malleolus.  Left finger splint is intact.  She can now raise her left arm overhead without much difficulty.  Shoulder is still a bit sore but improving.  Labs:   Recent Labs  05/05/13 2117 05/05/13 2130  HGB 11.8* 13.3   Recent Labs  05/05/13 2117 05/05/13 2130  WBC 17.4*  --   RBC 3.94  --   HCT 35.6* 39.0  PLT 329  --    Recent Labs  05/05/13 2130  NA 140  K 4.3  CL 104  BUN 18  CREATININE 1.20*  GLUCOSE 137*    Assessment and Plan: #1 left lateral tibial plateau fracture: Nonweightbearing with physical therapy, knee immobilizer.  Will discuss with Dr. Carola Frost regarding timing of surgery.  She will likely be discharged home once she clears physical therapy and come back for her surgery on an outpatient basis. #2 left small finger fracture: Continue splint #3 right shoulder and elbow pain: X-rays negative, likely contusion #4 tenderness over the left lateral malleolus: Will check x-rays today.  VTE proph: aspirin and SCDs
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
## 2013-05-08 DIAGNOSIS — S82143A Displaced bicondylar fracture of unspecified tibia, initial encounter for closed fracture: Secondary | ICD-10-CM

## 2013-05-08 DIAGNOSIS — S82109A Unspecified fracture of upper end of unspecified tibia, initial encounter for closed fracture: Secondary | ICD-10-CM | POA: Diagnosis not present

## 2013-05-08 LAB — URINE MICROSCOPIC-ADD ON

## 2013-05-08 LAB — URINALYSIS, ROUTINE W REFLEX MICROSCOPIC
Glucose, UA: NEGATIVE mg/dL
Hgb urine dipstick: NEGATIVE
Ketones, ur: NEGATIVE mg/dL
Protein, ur: NEGATIVE mg/dL
Specific Gravity, Urine: 1.019 (ref 1.005–1.030)
pH: 6 (ref 5.0–8.0)

## 2013-05-08 MED ORDER — OXYCODONE-ACETAMINOPHEN 5-325 MG PO TABS: 1.0000 | ORAL_TABLET | ORAL | Status: DC | PRN

## 2013-05-08 MED ORDER — ENOXAPARIN SODIUM 40 MG/0.4ML ~~LOC~~ SOLN: 40.0000 mg | SUBCUTANEOUS | Status: DC
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
## 2013-05-08 NOTE — Progress Notes (Signed)
Seen and agreed 05/08/2013 Robinette, Julia Elizabeth PTA [phone] pager [phone] office    

## 2013-05-08 NOTE — Progress Notes (Signed)
PATIENT ID: Courtney [name]        Subjective: Sitting comfortably in bed. Reports small gains in PT, sitting at edge of bed and using walked to move 4 steps to chair. Will work more with PT today. Discussed with patient Dr. Magdalene Patricia plan and d/c is pending PT clearance. She agrees with plan. No other complaints or concerns.   Objective:  Filed Vitals:   05/08/13 0602  BP: 117/67  Pulse: 83  Temp: 97.7 F (36.5 C)  Resp: 18     L LE dressing c/d/i Compartments soft/nontender Wiggles toes, distally NVI Left finger/hand with minimal swelling Left 4th and 5th digit buddy tape intact, splint is not being worn  Labs:   Recent Labs  05/05/13 2117 05/05/13 2130  HGB 11.8* 13.3   Recent Labs  05/05/13 2117 05/05/13 2130  WBC 17.4*  --   RBC 3.94  --   HCT 35.6* 39.0  PLT 329  --    Recent Labs  05/05/13 2130  NA 140  K 4.3  CL 104  BUN 18  CREATININE 1.20*  GLUCOSE 137*    Assessment and Plan: Dr. Carola Frost has evaluated and agreed to follow up with the patient in 1 week, anticipates tibial plateau ORIF on 05/22/13.  Reccommended lovenox for DVT prophylaxis, script in chart today percocet for home pain control, script in chart Okay for d/c home when cleared by PT, d/c orders submitted, home health Nonweightbearing Continue knee immobilizer  VTE proph: lovenox, SCDs

## 2013-05-08 NOTE — Care Management Note (Signed)
  Page 1 of 1   05/08/2013     9:58:18 AM   CARE MANAGEMENT NOTE 05/08/2013  Patient:  Courtney [name], Courtney [name]   Account Number:  [id number]  Date Initiated:  05/08/2013  Documentation initiated by:  Ronny Flurry  Subjective/Objective Assessment:     Action/Plan:   Anticipated DC Date:  05/08/2013   Anticipated DC Plan:  HOME W HOME HEALTH SERVICES         Choice offered to / List presented to:  C-1 Patient   DME arranged  [facility]      DME agency  [facility]     HH arranged  HH-2 PT      [facility] agency  [facility]   Status of service:   Medicare Important Message given?   (If response is "NO", the following Medicare IM given date fields will be blank) Date Medicare IM given:   Date Additional Medicare IM given:    Discharge Disposition:    Per UR Regulation:    If discussed at Long Length of Stay Meetings, dates discussed:    Comments:  05-08-13  Eelevated commode seat ordered also facesheet information confirmed . Ronny Flurry RN BSN [phone]
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
## 2013-05-09 DIAGNOSIS — S82109A Unspecified fracture of upper end of unspecified tibia, initial encounter for closed fracture: Secondary | ICD-10-CM | POA: Diagnosis not present

## 2013-05-09 MED ORDER — SULFAMETHOXAZOLE-TMP DS 800-160 MG PO TABS
1.0000 | ORAL_TABLET | Freq: Two times a day (BID) | ORAL | Status: DC
  Administered 2013-05-09: 1 via ORAL
  Filled 2013-05-09: qty 1

## 2013-05-09 MED ORDER — SULFAMETHOXAZOLE-TMP DS 800-160 MG PO TABS: 1.0000 | ORAL_TABLET | Freq: Two times a day (BID) | ORAL | Status: DC

## 2013-05-09 NOTE — Progress Notes (Signed)
Seen and agreed 05/09/2013 Robinette, Julia Elizabeth PTA [phone] pager [phone] office    

## 2013-05-09 NOTE — Progress Notes (Signed)
Discharge instructions and prescriptions given to pt.  Demonstrated lovenox injections to pt. And pt. Verbalized understanding and explained injection process back to me.  Pt.'s durable medical equipment sent home with pt. And husband.  Pt. Verbalized understanding of all orders and sent home with husband.   Courtney [name]

## 2013-05-09 NOTE — Progress Notes (Signed)
PATIENT ID: Courtney [name]        Subjective: Doing well today, reports some increased pain in L LE after ambulating to the bathroom this am. Reports pain well controlled with PO rx. No other complaints or concerns.   Objective:  Filed Vitals:   05/09/13 0537  BP: 124/78  Pulse: 88  Temp: 98.2 F (36.8 C)  Resp: 19     L LE dressing in ACE and knee immobilizer c/d/i No calf tenderness, compartments soft Wiggles toes, distally NVI  Labs:  No results found for this basename: HGB,  in the last 72 hoursNo results found for this basename: WBC, RBC, HCT, PLT,  in the last 72 hoursNo results found for this basename: NA, K, CL, CO2, BUN, CREATININE, GLUCOSE, CALCIUM,  in the last 72 hours  Assessment and Plan: Will work with PT today on ambulation NWB L LE Can d/c home when cleared by PT Percocet and lovenox script in chart Follow up with Dr. Carola Frost in 1 week, plan for surgery in 2 weeks  VTE proph: lovenox, SCDs
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
## 2013-05-11 LAB — URINE CULTURE

## 2013-05-16 ENCOUNTER — Encounter (HOSPITAL_COMMUNITY): Payer: Self-pay | Admitting: Pharmacist

## 2013-05-18 ENCOUNTER — Ambulatory Visit (HOSPITAL_COMMUNITY)
Admission: RE | Admit: 2013-05-18 | Discharge: 2013-05-18 | Disposition: A | Discharge status: Home / Self Care | Payer: BC Managed Care – PPO | Source: Ambulatory Visit | Attending: Anesthesiology | Admitting: Anesthesiology

## 2013-05-18 ENCOUNTER — Encounter (HOSPITAL_COMMUNITY)
Admission: RE | Admit: 2013-05-18 | Discharge: 2013-05-18 | Disposition: A | Discharge status: Home / Self Care | Payer: BC Managed Care – PPO | Source: Ambulatory Visit | Attending: Orthopedic Surgery | Admitting: Orthopedic Surgery

## 2013-05-18 ENCOUNTER — Encounter (HOSPITAL_COMMUNITY): Payer: Self-pay

## 2013-05-18 DIAGNOSIS — I10 Essential (primary) hypertension: Secondary | ICD-10-CM | POA: Insufficient documentation

## 2013-05-18 DIAGNOSIS — Z01818 Encounter for other preprocedural examination: Secondary | ICD-10-CM | POA: Insufficient documentation

## 2013-05-18 DIAGNOSIS — Z01812 Encounter for preprocedural laboratory examination: Secondary | ICD-10-CM | POA: Insufficient documentation

## 2013-05-18 DIAGNOSIS — Z01810 Encounter for preprocedural cardiovascular examination: Secondary | ICD-10-CM | POA: Insufficient documentation

## 2013-05-18 HISTORY — DX: Anemia, unspecified: D64.9

## 2013-05-18 LAB — COMPREHENSIVE METABOLIC PANEL
AST: 23 U/L (ref 0–37)
Albumin: 3.7 g/dL (ref 3.5–5.2)
Alkaline Phosphatase: 101 U/L (ref 39–117)
BUN: 17 mg/dL (ref 6–23)
CO2: 23 mEq/L (ref 19–32)
Chloride: 98 mEq/L (ref 96–112)
Creatinine, Ser: 0.97 mg/dL (ref 0.50–1.10)
GFR calc non Af Amer: 70 mL/min — ABNORMAL LOW (ref >90)
Potassium: 4.1 mEq/L (ref 3.5–5.1)
Sodium: 133 mEq/L — ABNORMAL LOW (ref 135–145)
Total Bilirubin: 0.6 mg/dL (ref 0.3–1.2)

## 2013-05-18 LAB — URINALYSIS, ROUTINE W REFLEX MICROSCOPIC

## 2013-05-18 LAB — CBC WITH DIFFERENTIAL/PLATELET
Basophils Relative: 0 % (ref 0–1)
Eosinophils Relative: 9 % — ABNORMAL HIGH (ref 0–5)
HCT: 34.4 % — ABNORMAL LOW (ref 36.0–46.0)
Hemoglobin: 11.4 g/dL — ABNORMAL LOW (ref 12.0–15.0)
Lymphocytes Relative: 30 % (ref 12–46)
Lymphs Abs: 2.8 10*3/uL (ref 0.7–4.0)
MCHC: 33.1 g/dL (ref 30.0–36.0)
Monocytes Absolute: 0.5 10*3/uL (ref 0.1–1.0)
Monocytes Relative: 5 % (ref 3–12)
Neutro Abs: 5.2 10*3/uL (ref 1.7–7.7)
Neutrophils Relative %: 56 % (ref 43–77)
RBC: 3.86 MIL/uL — ABNORMAL LOW (ref 3.87–5.11)
WBC: 9.3 10*3/uL (ref 4.0–10.5)

## 2013-05-18 LAB — PROTIME-INR: Prothrombin Time: 14 seconds (ref 11.6–15.2)

## 2013-05-18 LAB — APTT: aPTT: 32 seconds (ref 24–37)

## 2013-05-18 NOTE — Progress Notes (Signed)
PCP is Dr Maurice Small Denies seeing a cardiologist Denies having an echo, stress test, or card cath.

## 2013-05-18 NOTE — Pre-Procedure Instructions (Signed)
Courtney [name]  05/18/2013   Your procedure is scheduled on: Dec 9  Report to [facility]  2 * 3 at 0530 AM.  Call this number if you have problems the morning of surgery: [phone]   Remember:   Do not eat food or drink liquids after midnight.   Take these medicines the morning of surgery with A SIP OF WATER: Pain pill if needed  Stop taking Aspirin, Aleve, Ibuprofen, Herbal meds, Fish Oil, BC's, and Goody's   Do not wear jewelry, make-up or nail polish.  Do not wear lotions, powders, or perfumes. You may wear deodorant.  Do not shave 48 hours prior to surgery. Men may shave face and neck.  Do not bring valuables to the hospital.  [facility] is not responsible                  for any belongings or valuables.               Contacts, dentures or bridgework may not be worn into surgery.  Leave suitcase in the car. After surgery it may be brought to your room.  For patients admitted to the hospital, discharge time is determined by your                treatment team.               Patients discharged the day of surgery will not be allowed to drive  home.    Special Instructions: Shower using CHG 2 nights before surgery and the night before surgery.  If you shower the day of surgery use CHG.  Use special wash - you have one bottle of CHG for all showers.  You should use approximately 1/3 of the bottle for each shower.   Please read over the following fact sheets that you were given: Pain Booklet, Coughing and Deep Breathing, Blood Transfusion Information and Surgical Site Infection Prevention

## 2013-05-19 NOTE — Progress Notes (Signed)
Patient called reporting having congestion and cold type symptoms, denies fever. Recommended OTC tx such as Mucinex, recommended [facility] for fever or worsening symptoms, and requested patient call Dr. Carola Frost Monday if she is still having symptoms. Patient verbalized understanding.

## 2013-05-21 ENCOUNTER — Ambulatory Visit: Payer: BC Managed Care – PPO

## 2013-05-21 MED ORDER — DEXTROSE 5 % IV SOLN
3.0000 g | INTRAVENOUS | Status: AC
  Administered 2013-05-22: 3 g via INTRAVENOUS
  Filled 2013-05-21: qty 3000

## 2013-05-21 NOTE — H&P (Signed)
Please see consult note completed on 05/07/2013.  Pt seen and evaluated in our office on 05/16/2013 No interval changes in med hx Physical exam stable Soft tissue swelling resolved nicely   A/P  L split depression lateral tibial plateau fracture  OR for ORIF  NWB x 6 weeks Admit after OR for close observation, pain control and therapies Anticipate dc home in 2-3 days with HH PT  Mearl Latin, PA-C [facility] [phone] (P) 05/21/2013 10:16 PM

## 2013-05-22 ENCOUNTER — Inpatient Hospital Stay (HOSPITAL_COMMUNITY)
Admission: RE | Admit: 2013-05-22 | Discharge: 2013-05-25 | DRG: 493 | Disposition: A | Discharge status: Home / Self Care | Payer: BC Managed Care – PPO | Source: Ambulatory Visit | Attending: Orthopedic Surgery | Admitting: Orthopedic Surgery

## 2013-05-22 ENCOUNTER — Inpatient Hospital Stay (HOSPITAL_COMMUNITY): Payer: BC Managed Care – PPO

## 2013-05-22 ENCOUNTER — Ambulatory Visit (HOSPITAL_COMMUNITY): Payer: BC Managed Care – PPO | Admitting: Anesthesiology

## 2013-05-22 ENCOUNTER — Encounter (HOSPITAL_COMMUNITY)
Admission: RE | Disposition: A | Discharge status: Home / Self Care | Payer: Self-pay | Source: Ambulatory Visit | Attending: Orthopedic Surgery

## 2013-05-22 ENCOUNTER — Ambulatory Visit (HOSPITAL_COMMUNITY): Payer: BC Managed Care – PPO

## 2013-05-22 ENCOUNTER — Encounter (HOSPITAL_COMMUNITY): Payer: Self-pay | Admitting: *Deleted

## 2013-05-22 ENCOUNTER — Encounter (HOSPITAL_COMMUNITY): Payer: BC Managed Care – PPO | Admitting: Anesthesiology

## 2013-05-22 DIAGNOSIS — I10 Essential (primary) hypertension: Secondary | ICD-10-CM | POA: Diagnosis present

## 2013-05-22 DIAGNOSIS — S82142D Displaced bicondylar fracture of left tibia, subsequent encounter for closed fracture with routine healing: Secondary | ICD-10-CM

## 2013-05-22 DIAGNOSIS — E871 Hypo-osmolality and hyponatremia: Secondary | ICD-10-CM

## 2013-05-22 DIAGNOSIS — S82109A Unspecified fracture of upper end of unspecified tibia, initial encounter for closed fracture: Principal | ICD-10-CM | POA: Diagnosis present

## 2013-05-22 DIAGNOSIS — D62 Acute posthemorrhagic anemia: Secondary | ICD-10-CM | POA: Diagnosis present

## 2013-05-22 DIAGNOSIS — S83289A Other tear of lateral meniscus, current injury, unspecified knee, initial encounter: Secondary | ICD-10-CM | POA: Diagnosis present

## 2013-05-22 DIAGNOSIS — S82143A Displaced bicondylar fracture of unspecified tibia, initial encounter for closed fracture: Secondary | ICD-10-CM

## 2013-05-22 DIAGNOSIS — Z6841 Body Mass Index (BMI) 40.0 and over, adult: Secondary | ICD-10-CM

## 2013-05-22 HISTORY — PX: ORIF TIBIA PLATEAU: SHX2132

## 2013-05-22 LAB — CBC
HCT: 32.9 % — ABNORMAL LOW (ref 36.0–46.0)
MCH: 29.6 pg (ref 26.0–34.0)
MCHC: 33.1 g/dL (ref 30.0–36.0)
MCV: 89.4 fL (ref 78.0–100.0)
Platelets: 340 10*3/uL (ref 150–400)
RDW: 14.2 % (ref 11.5–15.5)
WBC: 8.2 10*3/uL (ref 4.0–10.5)

## 2013-05-22 LAB — CREATININE, SERUM: GFR calc non Af Amer: 58 mL/min — ABNORMAL LOW (ref >90)

## 2013-05-22 SURGERY — OPEN REDUCTION INTERNAL FIXATION (ORIF) TIBIAL PLATEAU
Anesthesia: General | Site: Leg Lower | Laterality: Left

## 2013-05-22 MED ORDER — PROMETHAZINE HCL 25 MG/ML IJ SOLN: 6.2500 mg | INTRAMUSCULAR | Status: DC | PRN

## 2013-05-22 MED ORDER — POTASSIUM CHLORIDE IN NACL 20-0.9 MEQ/L-% IV SOLN
INTRAVENOUS | Status: DC
  Administered 2013-05-22: 1 mL via INTRAVENOUS
  Administered 2013-05-23 (×3): via INTRAVENOUS
  Filled 2013-05-22 (×3): qty 1000

## 2013-05-22 MED ORDER — HYDROMORPHONE HCL PF 1 MG/ML IJ SOLN
INTRAMUSCULAR | Status: AC
  Filled 2013-05-22: qty 1

## 2013-05-22 MED ORDER — SODIUM CHLORIDE 0.9 % IJ SOLN: 9.0000 mL | INTRAMUSCULAR | Status: DC | PRN

## 2013-05-22 MED ORDER — ACETAMINOPHEN 500 MG PO TABS
1000.0000 mg | ORAL_TABLET | Freq: Once | ORAL | Status: AC
  Administered 2013-05-22: 1000 mg via ORAL

## 2013-05-22 MED ORDER — METHOCARBAMOL 100 MG/ML IJ SOLN: 500.0000 mg | Freq: Four times a day (QID) | INTRAVENOUS | Status: DC | PRN

## 2013-05-22 MED ORDER — METOCLOPRAMIDE HCL 5 MG/ML IJ SOLN: 5.0000 mg | Freq: Three times a day (TID) | INTRAMUSCULAR | Status: DC | PRN

## 2013-05-22 MED ORDER — OXYCODONE HCL 5 MG/5ML PO SOLN: 5.0000 mg | Freq: Once | ORAL | Status: AC | PRN

## 2013-05-22 MED ORDER — ADULT MULTIVITAMIN W/MINERALS CH
1.0000 | ORAL_TABLET | Freq: Every day | ORAL | Status: DC
  Administered 2013-05-22 – 2013-05-25 (×4): 1 via ORAL
  Filled 2013-05-22 (×4): qty 1

## 2013-05-22 MED ORDER — ONDANSETRON HCL 4 MG PO TABS: 4.0000 mg | ORAL_TABLET | Freq: Four times a day (QID) | ORAL | Status: DC | PRN

## 2013-05-22 MED ORDER — SUCCINYLCHOLINE CHLORIDE 20 MG/ML IJ SOLN
INTRAMUSCULAR | Status: DC | PRN
  Administered 2013-05-22: 120 mg via INTRAVENOUS

## 2013-05-22 MED ORDER — PROPOFOL 10 MG/ML IV BOLUS
INTRAVENOUS | Status: DC | PRN
  Administered 2013-05-22: 200 mg via INTRAVENOUS

## 2013-05-22 MED ORDER — DIPHENHYDRAMINE HCL 12.5 MG/5ML PO ELIX: 12.5000 mg | ORAL_SOLUTION | ORAL | Status: DC | PRN

## 2013-05-22 MED ORDER — OXYCODONE-ACETAMINOPHEN 5-325 MG PO TABS
1.0000 | ORAL_TABLET | ORAL | Status: DC | PRN
  Administered 2013-05-23 – 2013-05-25 (×3): 2 via ORAL
  Filled 2013-05-22 (×3): qty 2

## 2013-05-22 MED ORDER — MORPHINE SULFATE 4 MG/ML IJ SOLN
INTRAMUSCULAR | Status: AC
  Filled 2013-05-22: qty 1

## 2013-05-22 MED ORDER — GLYCOPYRROLATE 0.2 MG/ML IJ SOLN
INTRAMUSCULAR | Status: DC | PRN
  Administered 2013-05-22: .6 mg via INTRAVENOUS

## 2013-05-22 MED ORDER — OXYCODONE HCL 5 MG PO TABS
5.0000 mg | ORAL_TABLET | ORAL | Status: DC | PRN
  Administered 2013-05-23: 10 mg via ORAL
  Administered 2013-05-23 (×2): 5 mg via ORAL
  Filled 2013-05-22: qty 2
  Filled 2013-05-22 (×2): qty 1

## 2013-05-22 MED ORDER — MORPHINE SULFATE 4 MG/ML IJ SOLN
5.0000 mg | Freq: Once | INTRAMUSCULAR | Status: AC
  Administered 2013-05-22: 5 mg via INTRAVENOUS

## 2013-05-22 MED ORDER — METHOCARBAMOL 500 MG PO TABS
500.0000 mg | ORAL_TABLET | Freq: Four times a day (QID) | ORAL | Status: DC | PRN
  Administered 2013-05-23 – 2013-05-25 (×5): 500 mg via ORAL
  Filled 2013-05-22 (×5): qty 1

## 2013-05-22 MED ORDER — LIDOCAINE HCL (CARDIAC) 20 MG/ML IV SOLN
INTRAVENOUS | Status: DC | PRN
  Administered 2013-05-22: 80 mg via INTRAVENOUS

## 2013-05-22 MED ORDER — MAGNESIUM CITRATE PO SOLN: 1.0000 | Freq: Once | ORAL | Status: AC | PRN

## 2013-05-22 MED ORDER — BISACODYL 10 MG RE SUPP: 10.0000 mg | Freq: Every day | RECTAL | Status: DC | PRN

## 2013-05-22 MED ORDER — HYDROMORPHONE 0.3 MG/ML IV SOLN
INTRAVENOUS | Status: AC
  Filled 2013-05-22: qty 25

## 2013-05-22 MED ORDER — LACTATED RINGERS IV SOLN
INTRAVENOUS | Status: DC
  Administered 2013-05-22 (×2): via INTRAVENOUS

## 2013-05-22 MED ORDER — NEOSTIGMINE METHYLSULFATE 1 MG/ML IJ SOLN
INTRAMUSCULAR | Status: DC | PRN
  Administered 2013-05-22: 4 mg via INTRAVENOUS

## 2013-05-22 MED ORDER — MIDAZOLAM HCL 5 MG/5ML IJ SOLN
INTRAMUSCULAR | Status: DC | PRN
  Administered 2013-05-22: 2 mg via INTRAVENOUS

## 2013-05-22 MED ORDER — LABETALOL HCL 5 MG/ML IV SOLN
INTRAVENOUS | Status: DC | PRN
  Administered 2013-05-22: 10 mg via INTRAVENOUS

## 2013-05-22 MED ORDER — 0.9 % SODIUM CHLORIDE (POUR BTL) OPTIME
TOPICAL | Status: DC | PRN
  Administered 2013-05-22: 1000 mL

## 2013-05-22 MED ORDER — ENOXAPARIN SODIUM 40 MG/0.4ML ~~LOC~~ SOLN
40.0000 mg | SUBCUTANEOUS | Status: DC
  Administered 2013-05-23 – 2013-05-25 (×3): 40 mg via SUBCUTANEOUS
  Filled 2013-05-22 (×4): qty 0.4

## 2013-05-22 MED ORDER — DIPHENHYDRAMINE HCL 50 MG/ML IJ SOLN: 12.5000 mg | Freq: Four times a day (QID) | INTRAMUSCULAR | Status: DC | PRN

## 2013-05-22 MED ORDER — LABETALOL HCL 5 MG/ML IV SOLN
INTRAVENOUS | Status: AC
  Administered 2013-05-22: 5 mg
  Filled 2013-05-22: qty 4

## 2013-05-22 MED ORDER — POLYETHYLENE GLYCOL 3350 17 G PO PACK
17.0000 g | PACK | Freq: Every day | ORAL | Status: DC
  Administered 2013-05-23 – 2013-05-25 (×3): 17 g via ORAL
  Filled 2013-05-22 (×4): qty 1

## 2013-05-22 MED ORDER — FENTANYL CITRATE 0.05 MG/ML IJ SOLN
INTRAMUSCULAR | Status: DC | PRN
  Administered 2013-05-22: 150 ug via INTRAVENOUS
  Administered 2013-05-22: 50 ug via INTRAVENOUS
  Administered 2013-05-22: 100 ug via INTRAVENOUS
  Administered 2013-05-22: 50 ug via INTRAVENOUS
  Administered 2013-05-22: 150 ug via INTRAVENOUS

## 2013-05-22 MED ORDER — NALOXONE HCL 0.4 MG/ML IJ SOLN: 0.4000 mg | INTRAMUSCULAR | Status: DC | PRN

## 2013-05-22 MED ORDER — HYDROMORPHONE 0.3 MG/ML IV SOLN
INTRAVENOUS | Status: DC
  Administered 2013-05-22 (×2): via INTRAVENOUS
  Administered 2013-05-22: 4.2 mg via INTRAVENOUS
  Administered 2013-05-22: 1.2 mg via INTRAVENOUS
  Administered 2013-05-23: 2.1 mg via INTRAVENOUS
  Administered 2013-05-23: 2.7 mg via INTRAVENOUS
  Administered 2013-05-23: 1.5 mg via INTRAVENOUS
  Administered 2013-05-23: 10:00:00 via INTRAVENOUS
  Administered 2013-05-24: 2.1 mg via INTRAVENOUS
  Administered 2013-05-24: 0.6 mg via INTRAVENOUS
  Administered 2013-05-24: 1.5 mg via INTRAVENOUS
  Administered 2013-05-24: 06:00:00 via INTRAVENOUS
  Administered 2013-05-24: 0.3 mg via INTRAVENOUS
  Filled 2013-05-22 (×3): qty 25

## 2013-05-22 MED ORDER — METOCLOPRAMIDE HCL 10 MG PO TABS: 5.0000 mg | ORAL_TABLET | Freq: Three times a day (TID) | ORAL | Status: DC | PRN

## 2013-05-22 MED ORDER — HYDROMORPHONE HCL PF 1 MG/ML IJ SOLN
0.2500 mg | INTRAMUSCULAR | Status: DC | PRN
  Administered 2013-05-22 (×4): 0.5 mg via INTRAVENOUS

## 2013-05-22 MED ORDER — CEFAZOLIN SODIUM 1-5 GM-% IV SOLN
1.0000 g | Freq: Four times a day (QID) | INTRAVENOUS | Status: AC
  Administered 2013-05-22 – 2013-05-23 (×3): 1 g via INTRAVENOUS
  Filled 2013-05-22 (×3): qty 50

## 2013-05-22 MED ORDER — ONDANSETRON HCL 4 MG/2ML IJ SOLN: 4.0000 mg | Freq: Four times a day (QID) | INTRAMUSCULAR | Status: DC | PRN

## 2013-05-22 MED ORDER — DOCUSATE SODIUM 100 MG PO CAPS
100.0000 mg | ORAL_CAPSULE | Freq: Two times a day (BID) | ORAL | Status: DC
  Administered 2013-05-22 – 2013-05-25 (×6): 100 mg via ORAL
  Filled 2013-05-22 (×10): qty 1

## 2013-05-22 MED ORDER — OXYCODONE HCL 5 MG PO TABS
ORAL_TABLET | ORAL | Status: AC
  Filled 2013-05-22: qty 1

## 2013-05-22 MED ORDER — ONDANSETRON HCL 4 MG/2ML IJ SOLN
INTRAMUSCULAR | Status: DC | PRN
  Administered 2013-05-22: 4 mg via INTRAVENOUS

## 2013-05-22 MED ORDER — OXYCODONE HCL 5 MG PO TABS
5.0000 mg | ORAL_TABLET | Freq: Once | ORAL | Status: AC | PRN
  Administered 2013-05-22: 5 mg via ORAL

## 2013-05-22 MED ORDER — DIPHENHYDRAMINE HCL 12.5 MG/5ML PO ELIX: 12.5000 mg | ORAL_SOLUTION | Freq: Four times a day (QID) | ORAL | Status: DC | PRN

## 2013-05-22 MED ORDER — ROCURONIUM BROMIDE 100 MG/10ML IV SOLN
INTRAVENOUS | Status: DC | PRN
  Administered 2013-05-22: 20 mg via INTRAVENOUS
  Administered 2013-05-22: 60 mg via INTRAVENOUS

## 2013-05-22 MED ORDER — ALBUTEROL SULFATE HFA 108 (90 BASE) MCG/ACT IN AERS
INHALATION_SPRAY | RESPIRATORY_TRACT | Status: DC | PRN
  Administered 2013-05-22: 3 via RESPIRATORY_TRACT

## 2013-05-22 MED ORDER — FERROUS SULFATE 325 (65 FE) MG PO TABS
325.0000 mg | ORAL_TABLET | Freq: Three times a day (TID) | ORAL | Status: DC
  Administered 2013-05-22 – 2013-05-25 (×9): 325 mg via ORAL
  Filled 2013-05-22 (×11): qty 1

## 2013-05-22 MED ORDER — ACETAMINOPHEN 10 MG/ML IV SOLN
INTRAVENOUS | Status: AC
  Filled 2013-05-22: qty 100

## 2013-05-22 SURGICAL SUPPLY — 89 items
BANDAGE ELASTIC 4 VELCRO ST LF (GAUZE/BANDAGES/DRESSINGS) ×2 IMPLANT
BANDAGE ELASTIC 6 VELCRO ST LF (GAUZE/BANDAGES/DRESSINGS) ×2 IMPLANT
BANDAGE ESMARK 6X9 LF (GAUZE/BANDAGES/DRESSINGS) ×1 IMPLANT
BANDAGE GAUZE ELAST BULKY 4 IN (GAUZE/BANDAGES/DRESSINGS) ×2 IMPLANT
BIT DRILL 100X2.5XANTM LCK (BIT) IMPLANT
BIT DRILL CAL (BIT) IMPLANT
BIT DRL 100X2.5XANTM LCK (BIT) ×1
BLADE SURG 10 STRL SS (BLADE) ×2 IMPLANT
BLADE SURG 15 STRL LF DISP TIS (BLADE) ×1 IMPLANT
BLADE SURG 15 STRL SS (BLADE) ×2
BLADE SURG ROTATE 9660 (MISCELLANEOUS) IMPLANT
BNDG CMPR 9X6 STRL LF SNTH (GAUZE/BANDAGES/DRESSINGS) ×1
BNDG COHESIVE 4X5 TAN STRL (GAUZE/BANDAGES/DRESSINGS) ×2 IMPLANT
BNDG ESMARK 6X9 LF (GAUZE/BANDAGES/DRESSINGS) ×2
BNDG GAUZE ELAST 4 BULKY (GAUZE/BANDAGES/DRESSINGS) ×1 IMPLANT
BRUSH SCRUB DISP (MISCELLANEOUS) ×4 IMPLANT
CLOTH BEACON ORANGE TIMEOUT ST (SAFETY) ×2 IMPLANT
COVER MAYO STAND STRL (DRAPES) ×2 IMPLANT
CUFF TOURNIQUET SINGLE 44IN (TOURNIQUET CUFF) ×1 IMPLANT
Cerament bone void filler, 5mL (Bone Implant) ×1 IMPLANT
DRAPE C-ARM 42X72 X-RAY (DRAPES) ×2 IMPLANT
DRAPE C-ARMOR (DRAPES) ×2 IMPLANT
DRAPE INCISE IOBAN 66X45 STRL (DRAPES) ×2 IMPLANT
DRAPE ORTHO SPLIT 77X108 STRL (DRAPES) ×2
DRAPE SURG ORHT 6 SPLT 77X108 (DRAPES) IMPLANT
DRAPE U-SHAPE 47X51 STRL (DRAPES) ×2 IMPLANT
DRILL BIT 2.5MM (BIT) ×2
DRILL BIT CAL (BIT) ×2
DRSG ADAPTIC 3X8 NADH LF (GAUZE/BANDAGES/DRESSINGS) ×2 IMPLANT
DRSG PAD ABDOMINAL 8X10 ST (GAUZE/BANDAGES/DRESSINGS) ×5 IMPLANT
ELECT REM PT RETURN 9FT ADLT (ELECTROSURGICAL) ×2
ELECTRODE REM PT RTRN 9FT ADLT (ELECTROSURGICAL) ×1 IMPLANT
EVACUATOR 1/8 PVC DRAIN (DRAIN) IMPLANT
EVACUATOR 3/16  PVC DRAIN (DRAIN)
EVACUATOR 3/16 PVC DRAIN (DRAIN) IMPLANT
GLOVE BIO SURGEON STRL SZ7.5 (GLOVE) ×3 IMPLANT
GLOVE BIO SURGEON STRL SZ8 (GLOVE) ×3 IMPLANT
GLOVE BIOGEL PI IND STRL 7.5 (GLOVE) ×1 IMPLANT
GLOVE BIOGEL PI IND STRL 8 (GLOVE) ×1 IMPLANT
GLOVE BIOGEL PI INDICATOR 7.5 (GLOVE) ×1
GLOVE BIOGEL PI INDICATOR 8 (GLOVE) ×1
GOWN PREVENTION PLUS XLARGE (GOWN DISPOSABLE) ×2 IMPLANT
GOWN STRL NON-REIN LRG LVL3 (GOWN DISPOSABLE) ×4 IMPLANT
IMMOBILIZER KNEE 22 UNIV (SOFTGOODS) ×2 IMPLANT
K-WIRE ACE 1.6X6 (WIRE) ×8
KIT BASIN OR (CUSTOM PROCEDURE TRAY) ×2 IMPLANT
KIT ROOM TURNOVER OR (KITS) ×2 IMPLANT
KWIRE ACE 1.6X6 (WIRE) IMPLANT
MANIFOLD NEPTUNE II (INSTRUMENTS) ×2 IMPLANT
NDL SUT 6 .5 CRC .975X.05 MAYO (NEEDLE) IMPLANT
NEEDLE 22X1 1/2 (OR ONLY) (NEEDLE) IMPLANT
NEEDLE MAYO TAPER (NEEDLE)
NS IRRIG 1000ML POUR BTL (IV SOLUTION) ×2 IMPLANT
PACK ORTHO EXTREMITY (CUSTOM PROCEDURE TRAY) ×2 IMPLANT
PAD ARMBOARD 7.5X6 YLW CONV (MISCELLANEOUS) ×4 IMPLANT
PAD CAST 4YDX4 CTTN HI CHSV (CAST SUPPLIES) ×1 IMPLANT
PADDING CAST COTTON 4X4 STRL (CAST SUPPLIES) ×2
PADDING CAST COTTON 6X4 STRL (CAST SUPPLIES) ×2 IMPLANT
PLATE LOCK COMP 7H FOOT (Plate) ×1 IMPLANT
SCREW CORT FT 32X3.5XNONLOCK (Screw) IMPLANT
SCREW CORTICAL 3.5MM  32MM (Screw) ×1 IMPLANT
SCREW CORTICAL 3.5MM  34MM (Screw) ×1 IMPLANT
SCREW CORTICAL 3.5MM 32MM (Screw) ×1 IMPLANT
SCREW CORTICAL 3.5MM 34MM (Screw) IMPLANT
SCREW CORTICAL 3.5MM 38MM (Screw) ×1 IMPLANT
SCREW LOCK CANC STAR 4X75 (Screw) ×1 IMPLANT
SCREW LOCK CORT STAR 3.5X65 (Screw) ×3 IMPLANT
SCREW LOCK CORT STAR 3.5X70 (Screw) ×1 IMPLANT
SCREW LOCK CORT STAR 3.5X75 (Screw) ×3 IMPLANT
SPONGE GAUZE 4X4 12PLY (GAUZE/BANDAGES/DRESSINGS) ×2 IMPLANT
SPONGE LAP 18X18 X RAY DECT (DISPOSABLE) ×2 IMPLANT
STAPLER VISISTAT 35W (STAPLE) ×2 IMPLANT
STOCKINETTE IMPERVIOUS LG (DRAPES) ×2 IMPLANT
SUCTION FRAZIER TIP 10 FR DISP (SUCTIONS) ×2 IMPLANT
SUT ETHILON 3 0 PS 1 (SUTURE) ×2 IMPLANT
SUT PROLENE 0 CT 2 (SUTURE) ×4 IMPLANT
SUT VIC AB 0 CT1 27 (SUTURE) ×4
SUT VIC AB 0 CT1 27XBRD ANBCTR (SUTURE) ×1 IMPLANT
SUT VIC AB 1 CT1 27 (SUTURE) ×6
SUT VIC AB 1 CT1 27XBRD ANBCTR (SUTURE) ×1 IMPLANT
SUT VIC AB 2-0 CT1 27 (SUTURE) ×6
SUT VIC AB 2-0 CT1 TAPERPNT 27 (SUTURE) ×2 IMPLANT
SYR 20ML ECCENTRIC (SYRINGE) IMPLANT
TOWEL OR 17X24 6PK STRL BLUE (TOWEL DISPOSABLE) ×2 IMPLANT
TOWEL OR 17X26 10 PK STRL BLUE (TOWEL DISPOSABLE) ×4 IMPLANT
TRAY FOLEY CATH 16FRSI W/METER (SET/KITS/TRAYS/PACK) ×1 IMPLANT
TUBE CONNECTING 12X1/4 (SUCTIONS) ×3 IMPLANT
WATER STERILE IRR 1000ML POUR (IV SOLUTION) ×2 IMPLANT
YANKAUER SUCT BULB TIP NO VENT (SUCTIONS) ×3 IMPLANT

## 2013-05-22 NOTE — Progress Notes (Signed)
Patient instructed to remove earrings and glasses before she goes back to the OR, Voices understanding.

## 2013-05-22 NOTE — H&P (Signed)
I have seen and examined the patient. I agree with the findings above.  I discussed with the patient the risks and benefits of surgery for left tibial plateau repair, including the possibility of infection, nerve injury, vessel injury, wound breakdown, arthritis, symptomatic hardware, DVT/ PE, loss of motion, and need for further surgery among others.  She understood these risks and wished to proceed.   Budd Palmer, MD 05/22/2013 8:03 AM

## 2013-05-22 NOTE — Brief Op Note (Signed)
05/22/2013  10:46 AM  PATIENT:  Courtney [name]  42 y.o. female  PRE-OPERATIVE DIAGNOSIS:  LEFT TIBIAL PLATEAU FRACTURE  POST-OPERATIVE DIAGNOSIS:   1. LEFT TIBIAL PLATEAU FRACTURE 2. Lateral meniscus tear  PROCEDURE:  Procedure(s): 1. OPEN REDUCTION INTERNAL FIXATION (ORIF) LEFT TIBIAL PLATEAU (Left) 2. Arthrotomy with repair of lateral meniscus tear anterior horn to midbody avulsion 3. Anterior compartment fasciotomy  SURGEON:  Surgeon(s) and Role:    * Budd Palmer, MD - Primary  PHYSICIAN ASSISTANT: Montez Morita, PA-C  ANESTHESIA:   general  I/O:  Total I/O In: 1000 [I.V.:1000] Out: 300 [Urine:300]  SPECIMEN:  No Specimen  TOURNIQUET:   Total Tourniquet Time Documented: Thigh (Left) - 85 minutes Total: Thigh (Left) - 85 minutes   DICTATION: .Other Dictation: Dictation Number [id number]
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
## 2013-05-22 NOTE — Progress Notes (Signed)
Tiffnay informed of pt not having blood re drawn for type and screen due to pos. Antibodies, will  Request alb if needed.  Lab informed.

## 2013-05-22 NOTE — Anesthesia Preprocedure Evaluation (Signed)
Anesthesia Evaluation  Patient identified by MRN, date of birth, ID band Patient awake    Reviewed: Allergy & Precautions, H&P , Patient's Chart, lab work & pertinent test results  History of Anesthesia Complications Negative for: history of anesthetic complications  Airway Mallampati: I  Neck ROM: Full    Dental   Pulmonary neg pulmonary ROS,  breath sounds clear to auscultation        Cardiovascular hypertension, Rhythm:Regular Rate:Normal     Neuro/Psych negative neurological ROS     GI/Hepatic   Endo/Other    Renal/GU      Musculoskeletal   Abdominal   Peds  Hematology   Anesthesia Other Findings   Reproductive/Obstetrics                           Anesthesia Physical Anesthesia Plan  ASA: II  Anesthesia Plan: General   Post-op Pain Management:    Induction: Intravenous  Airway Management Planned: Oral ETT  Additional Equipment:   Intra-op Plan:   Post-operative Plan: Extubation in OR  Informed Consent: I have reviewed the patients History and Physical, chart, labs and discussed the procedure including the risks, benefits and alternatives for the proposed anesthesia with the patient or authorized representative who has indicated his/her understanding and acceptance.   Dental advisory given  Plan Discussed with: Surgeon and CRNA  Anesthesia Plan Comments:         Anesthesia Quick Evaluation

## 2013-05-22 NOTE — Preoperative (Signed)
Beta Blockers   Reason not to administer Beta Blockers:Not Applicable 

## 2013-05-22 NOTE — Anesthesia Postprocedure Evaluation (Signed)
Anesthesia Post Note  Patient: Courtney [name]  Procedure(s) Performed: Procedure(s) (LRB): OPEN REDUCTION INTERNAL FIXATION (ORIF) LEFT TIBIAL PLATEAU (Left)  Anesthesia type: general  Patient location: PACU  Post pain: Pain level controlled  Post assessment: Patient's Cardiovascular Status Stable  Last Vitals:  Filed Vitals:   05/22/13 1245  BP: 187/74  Pulse: 92  Temp:   Resp: 10    Post vital signs: Reviewed and stable  Level of consciousness: sedated  Complications: No apparent anesthesia complications

## 2013-05-22 NOTE — Transfer of Care (Signed)
Immediate Anesthesia Transfer of Care Note  Patient: Courtney [name]  Procedure(s) Performed: Procedure(s): OPEN REDUCTION INTERNAL FIXATION (ORIF) LEFT TIBIAL PLATEAU (Left)  Patient Location: PACU  Anesthesia Type:General  Level of Consciousness: awake, alert  and oriented  Airway & Oxygen Therapy: Patient Spontanous Breathing and Patient connected to nasal cannula oxygen  Post-op Assessment: Report given to PACU RN and Post -op Vital signs reviewed and stable  Post vital signs: Reviewed and stable  Complications: No apparent anesthesia complications

## 2013-05-23 DIAGNOSIS — S82109A Unspecified fracture of upper end of unspecified tibia, initial encounter for closed fracture: Secondary | ICD-10-CM

## 2013-05-23 DIAGNOSIS — I10 Essential (primary) hypertension: Secondary | ICD-10-CM | POA: Diagnosis present

## 2013-05-23 DIAGNOSIS — E871 Hypo-osmolality and hyponatremia: Secondary | ICD-10-CM

## 2013-05-23 LAB — CBC
HCT: 31.6 % — ABNORMAL LOW (ref 36.0–46.0)
Hemoglobin: 10.5 g/dL — ABNORMAL LOW (ref 12.0–15.0)
MCH: 30 pg (ref 26.0–34.0)
MCV: 90.3 fL (ref 78.0–100.0)
Platelets: 340 10*3/uL (ref 150–400)
RBC: 3.5 MIL/uL — ABNORMAL LOW (ref 3.87–5.11)
RDW: 14.6 % (ref 11.5–15.5)
WBC: 10.3 10*3/uL (ref 4.0–10.5)

## 2013-05-23 LAB — BASIC METABOLIC PANEL
BUN: 15 mg/dL (ref 6–23)
CO2: 21 mEq/L (ref 19–32)
Calcium: 8.4 mg/dL (ref 8.4–10.5)
Chloride: 100 mEq/L (ref 96–112)
Creatinine, Ser: 1.01 mg/dL (ref 0.50–1.10)
Glucose, Bld: 127 mg/dL — ABNORMAL HIGH (ref 70–99)
Potassium: 4.7 mEq/L (ref 3.5–5.1)

## 2013-05-23 MED ORDER — ACETAMINOPHEN 325 MG PO TABS
650.0000 mg | ORAL_TABLET | ORAL | Status: DC | PRN
  Administered 2013-05-23 (×2): 650 mg via ORAL
  Filled 2013-05-23 (×2): qty 2

## 2013-05-23 MED ORDER — LISINOPRIL-HYDROCHLOROTHIAZIDE 20-12.5 MG PO TABS: 1.0000 | ORAL_TABLET | Freq: Every day | ORAL | Status: DC

## 2013-05-23 MED ORDER — LISINOPRIL 20 MG PO TABS
20.0000 mg | ORAL_TABLET | Freq: Every day | ORAL | Status: DC
  Filled 2013-05-23: qty 1

## 2013-05-23 MED ORDER — HYDROCHLOROTHIAZIDE 12.5 MG PO CAPS
12.5000 mg | ORAL_CAPSULE | Freq: Every day | ORAL | Status: DC
  Filled 2013-05-23: qty 1

## 2013-05-23 NOTE — Progress Notes (Signed)
[facility] Progress Note  Subjective  C/o significant pain this am Some sleep last night Not all that hungry Foley still in place Wants CIR Has not been out of bed yet Elevated temp overnight but doing ok now  BP elevated this am  Review of Systems  Constitutional: Negative for chills.  Respiratory: Negative for cough, shortness of breath and wheezing.   Cardiovascular: Negative for chest pain and palpitations.  Gastrointestinal: Negative for nausea and vomiting.  Genitourinary:       Foley  Neurological: Negative for tingling, sensory change and headaches.      Objective   BP 143/73  Pulse 116  Temp(Src) 100.2 F (37.9 C) (Oral)  Resp 17  Ht 5' 4.5" (1.638 m)  Wt 135.626 kg (299 lb)  BMI 50.55 kg/m2  SpO2 98%  LMP 05/17/2013  Intake/Output     12/09 0701 - 12/10 0700 12/10 0701 - 12/11 0700   P.O. 840    I.V. (mL/kg) 2950 (21.8)    Total Intake(mL/kg) 3790 (27.9)    Urine (mL/kg/hr) 550 (0.2)    Total Output 550     Net +3240            Labs Results for JEWELIA, BOCCHINO (MRN [id number]) as of 05/23/2013 07:52  Ref. Range 05/23/2013 04:45  Sodium Latest Range: 135-145 mEq/L 130 (L)  Potassium Latest Range: 3.5-5.1 mEq/L 4.7  Chloride Latest Range: 96-112 mEq/L 100  CO2 Latest Range: 19-32 mEq/L 21  BUN Latest Range: 6-23 mg/dL 15  Creatinine Latest Range: 0.50-1.10 mg/dL 1.19  Calcium Latest Range: 8.4-10.5 mg/dL 8.4  GFR calc non Af Amer Latest Range: >90 mL/min 67 (L)  GFR calc Af Amer Latest Range: >90 mL/min 77 (L)  Glucose Latest Range: 70-99 mg/dL 147 (H)  WBC Latest Range: 4.0-10.5 K/uL 10.3  RBC Latest Range: 3.87-5.11 MIL/uL 3.50 (L)  Hemoglobin Latest Range: 12.0-15.0 g/dL 82.9 (L)  HCT Latest Range: 36.0-46.0 % 31.6 (L)  MCV Latest Range: 78.0-100.0 fL 90.3  MCH Latest Range: 26.0-34.0 pg 30.0  MCHC Latest Range: 30.0-36.0 g/dL 56.2  RDW Latest Range: 11.5-15.5 % 14.6  Platelets Latest Range: 150-400 K/uL 340     Exam  ZHY:QMVHQ and alert, looks miserable  Lungs: CTA B  Cardiac: Reg, S1 and S2 Abd: soft NT, +BS Ext:       Left Lower Extremity   Dressing C/D/I  Distal motor and sensory functions intact  No pain with passive stretch  Ext warm  + DP pulse  No DCT  Compartments soft and NT  Swelling stable     Assessment and Plan   POD/HD#: 1  1. L split depression Lateral tibial plateau fracture  POD 1  NWB X 6 weeks  Ok to begin unrestricted knee ROM  Will see if pt can be fitted into hinge  Ice and elevate  PT/OT evals   Dressing change either tomorrow or Friday  Total knee precautions    2. HTN  BP elevated but will continue to hold home meds  Monitor sodium, will restart home meds once sodium normalized   Will kvo IVF  3. Pain management:  Pt still requiring IV analgesia for adequate relief  Discussed with RN that I have also ordered PO meds to be give in addition to PCA for breakthrough pain   4. ABL anemia/Hemodynamics  H/H stable  Monitor   BP slightly elevated, see #2  5. DVT/PE prophylaxis:  Started on Lovenox   Will dc on  ASA 325 mg po BID x 6 weeks  6. ID:   Completed periop abx  7. Activity:  OOB as tolerated  NWB L leg  PT/OT evals  Unrestricted ROM L knee  8. FEN/Foley/Lines:  KVO IVF  Keep foley until evaluated by PT  Diet as tolerated   Hyponatremia- likely related to intra and post op fluid shifts. Will KVO IVF, check BMET in am. No symptoms at current time.  Hold HTN meds so as not to exacerbate hyponatremia   9. Dispo:  PT/OT evals  CIR eval  Continue with IV pain meds for today  Will dc those IV meds tomorrow  Pt not clinically stable for dc to home or other venue at this time     Mearl Latin, PA-C [facility] [phone] (P) 05/23/2013 7:51 AM
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
## 2013-05-23 NOTE — Progress Notes (Signed)
Orthopedic Tech Progress Note Patient Details:  [name] Sep 20, 1970 [id number]      Shawnie Pons 05/23/2013, 9:12 AMTrapeze bar

## 2013-05-23 NOTE — Consult Note (Signed)
I have reviewed and discussed in detail with Dr. Ave Filter and Mr. Renae Fickle the patient's presentation, examination findings, and I formulated the plan outlined above.  Myrene Galas, MD [facility] [phone] (p)

## 2013-05-23 NOTE — Progress Notes (Signed)
OT Cancellation Note  Patient Details Name: Courtney [name] MRN: [id number] DOB: 1971-06-04   Cancelled Treatment:    Reason Eval/Treat Not Completed: Pain limiting ability to participate. Will continue to follow.  Evern Bio 05/23/2013, 9:59 AM
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
## 2013-05-23 NOTE — Progress Notes (Signed)
Per Montez Morita PA order. Will leave foley until tomorrow for strict output monitoring. Pt still requiring a lot of assistance with movement due to pain. Will continue to monitor.

## 2013-05-24 LAB — CBC
HCT: 29.7 % — ABNORMAL LOW (ref 36.0–46.0)
MCV: 90.5 fL (ref 78.0–100.0)
Platelets: 282 10*3/uL (ref 150–400)
RBC: 3.28 MIL/uL — ABNORMAL LOW (ref 3.87–5.11)
WBC: 9.2 10*3/uL (ref 4.0–10.5)

## 2013-05-24 LAB — BASIC METABOLIC PANEL
CO2: 23 mEq/L (ref 19–32)
Calcium: 9 mg/dL (ref 8.4–10.5)
Chloride: 97 mEq/L (ref 96–112)
Creatinine, Ser: 0.91 mg/dL (ref 0.50–1.10)
GFR calc Af Amer: 88 mL/min — ABNORMAL LOW (ref >90)
Glucose, Bld: 120 mg/dL — ABNORMAL HIGH (ref 70–99)
Sodium: 131 mEq/L — ABNORMAL LOW (ref 135–145)

## 2013-05-24 MED ORDER — OXYCODONE HCL 5 MG PO TABS
5.0000 mg | ORAL_TABLET | ORAL | Status: DC | PRN
  Administered 2013-05-24 (×3): 10 mg via ORAL
  Administered 2013-05-25: 15 mg via ORAL
  Administered 2013-05-25: 10 mg via ORAL
  Filled 2013-05-24 (×3): qty 2
  Filled 2013-05-24: qty 3
  Filled 2013-05-24: qty 2

## 2013-05-24 MED ORDER — HYDROMORPHONE HCL PF 1 MG/ML IJ SOLN: 0.5000 mg | INTRAMUSCULAR | Status: DC | PRN

## 2013-05-24 NOTE — Progress Notes (Addendum)
Clinical Social Work Department CLINICAL SOCIAL WORK PLACEMENT NOTE 05/24/2013  Patient:  DUANNA, RUNK  Account Number:  [id number] Admit date:  05/22/2013  Clinical Social Worker:  Sharol Harness, Theresia Majors  Date/time:  05/24/2013 02:00 PM  Clinical Social Work is seeking post-discharge placement for this patient at the following level of care:   SKILLED NURSING   (*CSW will update this form in Epic as items are completed)   05/24/2013  Patient/family provided with [facility] Department of Clinical Social Work's list of facilities offering this level of care within the geographic area requested by the patient (or if unable, by the patient's family).  05/24/2013  Patient/family informed of their freedom to choose among providers that offer the needed level of care, that participate in Medicare, Medicaid or managed care program needed by the patient, have an available bed and are willing to accept the patient.  05/24/2013  Patient/family informed of [facility]' ownership interest in [facility], as well as of the fact that they are under no obligation to receive care at this facility.  PASARR submitted to EDS on 05/24/2013 PASARR number received from [employer] on 05/24/2013  FL2 transmitted to all facilities in geographic area requested by pt/family on  05/24/2013 FL2 transmitted to all facilities within larger geographic area on   Patient informed that his/her managed care company has contracts with or will negotiate with  certain facilities, including the following:     Patient/family informed of bed offers received:  05/24/2013 Patient chooses bed at  Physician recommends and patient chooses bed at    Patient to be transferred to  on  HOME Patient to be transferred to facility by   The following physician request were entered in Epic:   Additional Comments:   Cristin Penaflor, LCSWA [phone]

## 2013-05-24 NOTE — Progress Notes (Addendum)
32ml/5.4mg  of PCA Dilaudid wasted in sink via [employer] at [employer], witnessed by Arty Baumgartner.
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
## 2013-05-24 NOTE — Progress Notes (Signed)
Orthopaedic Trauma Service (OTS)  Subjective: 2 Days Post-Op Procedure(s) (LRB): OPEN REDUCTION INTERNAL FIXATION (ORIF) LEFT TIBIAL PLATEAU (Left) Patient reports pain as moderate.   Clutching PCA.  OOB yesterday with PT, but unable to do much secondary to pain.  Objective: Current Vitals Blood pressure 125/71, pulse 103, temperature 99.1 F (37.3 C), temperature source Oral, resp. rate 16, height 5' 4.5" (1.638 m), weight 299 lb (135.626 kg), last menstrual period 05/17/2013, SpO2 96.00%. Vital signs in last 24 hours: Temp:  [99.1 F (37.3 C)-101.4 F (38.6 C)] 99.1 F (37.3 C) (12/11 0616) Pulse Rate:  [103-112] 103 (12/11 0616) Resp:  [12-16] 16 (12/11 0754) BP: (125-131)/(53-80) 125/71 mmHg (12/11 0616) SpO2:  [93 %-99 %] 96 % (12/11 0754)  Intake/Output from previous day: 12/10 0701 - 12/11 0700 In: 1140 [P.O.:840; I.V.:300] Out: 3000 [Urine:3000]  LABS  Recent Labs  05/22/13 1350 05/23/13 0445 05/24/13 0443  HGB 10.9* 10.5* 9.6*    Recent Labs  05/23/13 0445 05/24/13 0443  WBC 10.3 9.2  RBC 3.50* 3.28*  HCT 31.6* 29.7*  PLT 340 282    Recent Labs  05/23/13 0445 05/24/13 0443  NA 130* 131*  K 4.7 4.2  CL 100 97  CO2 21 23  BUN 15 10  CREATININE 1.01 0.91  GLUCOSE 127* 120*  CALCIUM 8.4 9.0   No results found for this basename: LABPT, INR,  in the last 72 hours  Physical Exam  No wheezing LLE Dressing clean and dry, hinged brace in place  Sens DPN, SPN, TN intact  Motor EHL, ext, flex, evers intact  DP 2+, PT 2+   Imaging Dg Knee Complete 4 Views Left  05/22/2013   CLINICAL DATA:  ORIF tibial plateau fracture.  EXAM: LEFT KNEE - COMPLETE 4+ VIEW; DG C-ARM 1-60 MIN  COMPARISON:  Plain films of the left knee 05/05/2013.  FINDINGS: We are provided with for fluoroscopic intraoperative spot views of the left knee. Images demonstrate placement of lateral plate and screws for fixation of a tibial plateau fracture. No acute abnormality is  identified.  IMPRESSION: ORIF left tibial plateau fracture.   Electronically Signed   By: Drusilla Kanner M.D.   On: 05/22/2013 14:00   Dg Knee Left Port  05/22/2013   CLINICAL DATA:  Status post fixation of a tibial plateau fracture of the left knee.  EXAM: PORTABLE LEFT KNEE - 1-2 VIEW  COMPARISON:  Plain films left knee 05/05/2013.  FINDINGS: Lateral plate and screws and methylmethacrylate are in place for fixation of a lateral tibial plateau fracture. Positioned and alignment near anatomic. Hardware is intact. No acute abnormality is identified. Small joint effusion and gas the soft tissues are noted.  IMPRESSION: ORIF lateral tibial plateau fracture left knee without evidence of complication.   Electronically Signed   By: Drusilla Kanner M.D.   On: 05/22/2013 14:11   Dg C-arm 1-60 Min  05/22/2013   CLINICAL DATA:  ORIF tibial plateau fracture.  EXAM: LEFT KNEE - COMPLETE 4+ VIEW; DG C-ARM 1-60 MIN  COMPARISON:  Plain films of the left knee 05/05/2013.  FINDINGS: We are provided with for fluoroscopic intraoperative spot views of the left knee. Images demonstrate placement of lateral plate and screws for fixation of a tibial plateau fracture. No acute abnormality is identified.  IMPRESSION: ORIF left tibial plateau fracture.   Electronically Signed   By: Drusilla Kanner M.D.   On: 05/22/2013 14:00    Assessment/Plan: 2 Days Post-Op Procedure(s) (LRB): OPEN REDUCTION INTERNAL  FIXATION (ORIF) LEFT TIBIAL PLATEAU (Left)  D/c pca and ivf PT OT with NWB, AROM/PRom of knee Hopeful can d/c late today but slow progress w PT and difficulty managing pain may prevent this; have encouraged patiebnt re po pain meds.   Myrene Galas, MD [facility] [phone] (p)   05/24/2013, 8:35 AM
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
## 2013-05-24 NOTE — Progress Notes (Signed)
Clinical Social Work Department BRIEF PSYCHOSOCIAL ASSESSMENT 05/24/2013  Patient:  Courtney [name], Courtney [name]     Account Number:  [id number]     Admit date:  05/22/2013  Clinical Social Worker:  Harless Nakayama  Date/Time:  05/24/2013 02:00 PM  Referred by:  Physician  Date Referred:  05/24/2013 Referred for  SNF Placement   Other Referral:   Interview type:  Patient Other interview type:    PSYCHOSOCIAL DATA Living Status:  HUSBAND Admitted from facility:   Level of care:   Primary support name:  Shaquanda Graves [phone] Primary support relationship to patient:  SPOUSE Degree of support available:   Pt has supportive husband at home    CURRENT CONCERNS Current Concerns  Post-Acute Placement   Other Concerns:    SOCIAL WORK ASSESSMENT / PLAN CSW informed by CM that pt is progressing very slowly with PT. CSW read CIR note saying they feel pt is more appropriate for SNF and talked with CM about this. CM and CSW unsure if insurance will cover pt for SNF. CSW spoke with pt about SNF and explained concerns about insurance. Pt still wants CSW to try. CSW explained SNF referral process. Pt is agreeable to being faxed out to [facility]. and has no preferences at this time. CSW also informed pt to begin planning a back up option for home incase insurance does not approve SNF.   Assessment/plan status:  Psychosocial Support/Ongoing Assessment of Needs Other assessment/ plan:   Information/referral to community resources:   SNF list to be provided with bed offers    PATIENT'S/FAMILY'S RESPONSE TO PLAN OF CARE: Pt is hoping to dc to SNF.       Lazara Grieser, LCSWA [phone]

## 2013-05-24 NOTE — Progress Notes (Signed)
Rehab admissions - Evaluated for possible admission.  I spoke with patient.  I do not think that [employer]BS would authorize for acute inpatient rehab admission given single extremity involvement.  Patient would like to explore the possibility of SNF placement.  She has only intermittent assistance if she is discharged home with [facility] therapies.  Call me for questions.  #[phone]

## 2013-05-24 NOTE — Progress Notes (Signed)
I have seen and examined the patient. I agree with the findings above.  Budd Palmer, MD 05/24/2013 8:35 AM

## 2013-05-25 ENCOUNTER — Encounter (HOSPITAL_COMMUNITY): Payer: Self-pay | Admitting: Orthopedic Surgery

## 2013-05-25 LAB — BASIC METABOLIC PANEL
BUN: 12 mg/dL (ref 6–23)
CO2: 25 mEq/L (ref 19–32)
Chloride: 98 mEq/L (ref 96–112)
Creatinine, Ser: 0.9 mg/dL (ref 0.50–1.10)

## 2013-05-25 LAB — CBC
HCT: 28.5 % — ABNORMAL LOW (ref 36.0–46.0)
MCV: 88.5 fL (ref 78.0–100.0)
RBC: 3.22 MIL/uL — ABNORMAL LOW (ref 3.87–5.11)
RDW: 14.2 % (ref 11.5–15.5)
WBC: 7.9 10*3/uL (ref 4.0–10.5)

## 2013-05-25 MED ORDER — POLYETHYLENE GLYCOL 3350 17 G PO PACK: 17.0000 g | PACK | Freq: Every day | ORAL | Status: DC

## 2013-05-25 MED ORDER — DSS 100 MG PO CAPS: 100.0000 mg | ORAL_CAPSULE | Freq: Two times a day (BID) | ORAL | Status: DC

## 2013-05-25 MED ORDER — ENOXAPARIN SODIUM 40 MG/0.4ML ~~LOC~~ SOLN: 40.0000 mg | SUBCUTANEOUS | Status: DC

## 2013-05-25 MED ORDER — METHOCARBAMOL 500 MG PO TABS: 500.0000 mg | ORAL_TABLET | Freq: Four times a day (QID) | ORAL | Status: DC | PRN

## 2013-05-25 MED ORDER — OXYCODONE HCL 5 MG PO TABS: 5.0000 mg | ORAL_TABLET | ORAL | Status: DC | PRN

## 2013-05-25 MED ORDER — OXYCODONE-ACETAMINOPHEN 5-325 MG PO TABS: 1.0000 | ORAL_TABLET | Freq: Four times a day (QID) | ORAL | Status: DC | PRN
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
## 2013-05-25 NOTE — Progress Notes (Signed)
RN reviewed discharge instructions with patient and husband. All questions answered. Prescriptions given.   Patient wheeled down with RN and NT to help get patient into car.

## 2013-05-25 NOTE — Progress Notes (Signed)
CSW (Clinical Child psychotherapist) received call from pt stating dc plan has changed and she will be going home and would like [facility] services. CSW will inform CM. CSW signing off.  Verle [place], LCSWA [phone]
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
## 2013-05-25 NOTE — Care Management Note (Signed)
CARE MANAGEMENT NOTE 05/25/2013   Patient:  Courtney [name],Courtney [name]   Account Number:  [id number]  Date Initiated:  05/22/2013  Documentation initiated by:  [place]  Subjective/Objective Assessment:   42 yr old female admitted for ORIF of left tibial plateau fracture.     Action/Plan:   PT/OT eval  CM spoke with patient concerning HH and DME needs. Choice offered. Patient wanted SNF but has changed her mind. Notified Mary Hickling, [facility] liasion. Patient has RW and 3in1.   Anticipated DC Date:  05/25/2013   Anticipated DC Plan:  HOME W HOME HEALTH SERVICES      DC Planning Services  CM consult      PAC Choice  HOME HEALTH   Choice offered to / List presented to:  C-1 Patient   DME arranged  NA      DME agency  NA     HH arranged  HH-2 PT      HH agency  [facility]   Status of service:  Completed, signed off Discharge Disposition:  HOME W HOME HEALTH SERVICES  

## 2013-05-26 LAB — TYPE AND SCREEN
Antibody Screen: POSITIVE
DAT, IgG: NEGATIVE
Donor AG Type: NEGATIVE
PT AG Type: NEGATIVE

## 2013-06-19 ENCOUNTER — Ambulatory Visit: Payer: BC Managed Care – PPO | Attending: Orthopedic Surgery

## 2013-06-19 DIAGNOSIS — M6281 Muscle weakness (generalized): Secondary | ICD-10-CM | POA: Insufficient documentation

## 2013-06-19 DIAGNOSIS — M79609 Pain in unspecified limb: Secondary | ICD-10-CM | POA: Insufficient documentation

## 2013-06-19 DIAGNOSIS — M25669 Stiffness of unspecified knee, not elsewhere classified: Secondary | ICD-10-CM | POA: Insufficient documentation

## 2013-06-19 DIAGNOSIS — R262 Difficulty in walking, not elsewhere classified: Secondary | ICD-10-CM | POA: Insufficient documentation

## 2013-06-19 DIAGNOSIS — IMO0001 Reserved for inherently not codable concepts without codable children: Secondary | ICD-10-CM | POA: Insufficient documentation

## 2013-06-19 DIAGNOSIS — M25569 Pain in unspecified knee: Secondary | ICD-10-CM | POA: Insufficient documentation

## 2013-06-20 ENCOUNTER — Ambulatory Visit: Payer: BC Managed Care – PPO | Admitting: Rehabilitation

## 2013-06-25 ENCOUNTER — Encounter: Payer: BC Managed Care – PPO | Admitting: Rehabilitation

## 2013-06-26 ENCOUNTER — Ambulatory Visit: Payer: BC Managed Care – PPO | Admitting: Physical Therapy

## 2013-06-27 ENCOUNTER — Ambulatory Visit: Payer: BC Managed Care – PPO | Admitting: Rehabilitation

## 2013-06-28 ENCOUNTER — Ambulatory Visit: Payer: BC Managed Care – PPO | Admitting: Rehabilitation

## 2013-07-02 ENCOUNTER — Ambulatory Visit: Payer: BC Managed Care – PPO | Admitting: Rehabilitation

## 2013-07-04 ENCOUNTER — Ambulatory Visit: Payer: BC Managed Care – PPO | Admitting: Rehabilitation

## 2013-07-05 ENCOUNTER — Ambulatory Visit: Payer: BC Managed Care – PPO | Admitting: Physical Therapy

## 2013-07-09 ENCOUNTER — Ambulatory Visit: Payer: BC Managed Care – PPO | Admitting: Rehabilitation

## 2013-07-10 ENCOUNTER — Encounter: Payer: BC Managed Care – PPO | Admitting: Physical Therapy

## 2013-07-10 ENCOUNTER — Ambulatory Visit: Payer: BC Managed Care – PPO | Admitting: Physical Therapy

## 2013-07-11 ENCOUNTER — Ambulatory Visit: Payer: BC Managed Care – PPO | Admitting: Physical Therapy

## 2013-07-16 ENCOUNTER — Other Ambulatory Visit: Payer: BC Managed Care – PPO

## 2013-07-16 ENCOUNTER — Ambulatory Visit (HOSPITAL_BASED_OUTPATIENT_CLINIC_OR_DEPARTMENT_OTHER): Payer: BC Managed Care – PPO | Admitting: Genetic Counselor

## 2013-07-16 ENCOUNTER — Encounter: Payer: BC Managed Care – PPO | Admitting: Genetic Counselor

## 2013-07-16 DIAGNOSIS — IMO0002 Reserved for concepts with insufficient information to code with codable children: Secondary | ICD-10-CM

## 2013-07-16 DIAGNOSIS — D0510 Intraductal carcinoma in situ of unspecified breast: Secondary | ICD-10-CM

## 2013-07-16 DIAGNOSIS — Z803 Family history of malignant neoplasm of breast: Secondary | ICD-10-CM

## 2013-07-16 DIAGNOSIS — Z8042 Family history of malignant neoplasm of prostate: Secondary | ICD-10-CM

## 2013-07-16 DIAGNOSIS — Z87898 Personal history of other specified conditions: Secondary | ICD-10-CM

## 2013-07-17 ENCOUNTER — Encounter: Payer: Self-pay | Admitting: Genetic Counselor

## 2013-07-17 ENCOUNTER — Ambulatory Visit: Payer: BC Managed Care – PPO | Attending: Orthopedic Surgery

## 2013-07-17 DIAGNOSIS — R262 Difficulty in walking, not elsewhere classified: Secondary | ICD-10-CM | POA: Insufficient documentation

## 2013-07-17 DIAGNOSIS — M25569 Pain in unspecified knee: Secondary | ICD-10-CM | POA: Insufficient documentation

## 2013-07-17 DIAGNOSIS — M6281 Muscle weakness (generalized): Secondary | ICD-10-CM | POA: Insufficient documentation

## 2013-07-17 DIAGNOSIS — M25669 Stiffness of unspecified knee, not elsewhere classified: Secondary | ICD-10-CM | POA: Insufficient documentation

## 2013-07-17 DIAGNOSIS — M79609 Pain in unspecified limb: Secondary | ICD-10-CM | POA: Insufficient documentation

## 2013-07-17 DIAGNOSIS — IMO0001 Reserved for inherently not codable concepts without codable children: Secondary | ICD-10-CM | POA: Insufficient documentation

## 2013-07-17 NOTE — Progress Notes (Signed)
Dr.  Evlyn Clines requested a consultation for genetic counseling and risk assessment for Courtney [name], a 43 y.o. female, for discussion of her personal history of breast cancer and family history of breast and prostate cancer and sarcoma.  She presents to clinic today to discuss the possibility of a genetic predisposition to cancer, and to further clarify her risks, as well as her family members' risks for cancer.   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 This was treated with right sided mastectomy and tamoxifen for 5 years.  The tumor was ER+/PR+.  She had BRCA testing through [facility] in 2010 and was negative.  BART was not performed.  She has not had a colonoscopy.    Past Medical History  Diagnosis Date  . Abscess of chest wall     left  . Hypertension   . Cancer     right breast  . Pain, dental   . Anemia     Past Surgical History  Procedure Laterality Date  . Breast surgery  2004    mastectomy - right  . Breast reduction surgery    . Incise and drain abcess      left breast  . Mastectomy      right  . Coil       to prevent preg when she had breast ca surgery  . Orif tibia plateau Left 05/22/2013    Procedure: OPEN REDUCTION INTERNAL FIXATION (ORIF) LEFT TIBIAL PLATEAU;  Surgeon: Rozanna Box, MD;  Location: [facility];  Service: Orthopedics;  Laterality: Left;    History   Social History  . Marital Status: Married    Spouse Name: N/A    Number of Children: 1  . Years of Education: N/A   Occupational History  .     Social History Main Topics  . Smoking status: Never Smoker   . Smokeless tobacco: Never Used  . Alcohol Use: No  . Drug Use: No  . Sexual Activity: None   Other Topics Concern  . None   Social History Narrative  . None    REPRODUCTIVE HISTORY AND PERSONAL RISK ASSESSMENT FACTORS: Menarche was at age 14-10.   premenopausal Uterus Intact: yes Ovaries  Intact: yes G1P1A0, first live birth at age 21  She has not previously undergone treatment for infertility.   Oral Contraceptive use: 10 years   She has not used HRT in the past.    FAMILY HISTORY:  We obtained a detailed, 4-generation family history.  Significant diagnoses are listed below: Family History  Problem Relation Age of Onset  . Heart disease Father   . Prostate cancer Father     dx in his 28s  . Cancer Brother 9    sarcoma detected in his leg  . Breast cancer Paternal Aunt     dx at 68-55  . Lymphoma Maternal Aunt   . Cancer Other 23    unknown form of cancer; son of brother with sarcoma  . Breast cancer Cousin     4 maternal cousins with breast cancer dx in their 83s; 3 cousin are sisters to each other    Patient's ancestors are of African American descent. There is no reported Ashkenazi Jewish ancestry. There is no known consanguinity.  GENETIC COUNSELING ASSESSMENT: Courtney [name] is a 43 y.o. female with a personal history of DCIS breast cancer and family history of sarcoma,  and breast and prostate cancer which somewhat suggestive of a hereditary cancer syndrome and predisposition to cancer. We, therefore, discussed and recommended the following at today's visit.   DISCUSSION: We reviewed the characteristics, features and inheritance patterns of hereditary cancer syndromes. We also discussed genetic testing, including the appropriate family members to test, the process of testing, insurance coverage and turn-around-time for results. We reviewed her family history in light of her previously negative BRCA testing.  We discussed del/dup testing, as well as TP53 testing based on her very early onset of breast cancer and the sarcoma in her brother.  We reviewed that there are other hereditary breast cancer genes that increase the risk for early onset breast cancer.  Therefore, we will perform further genetic testing.  PLAN: After considering the risks, benefits, and  limitations, Courtney [name] provided informed consent to pursue genetic testing and the blood sample will be sent to [facility] for analysis of the Breast/Ovarian cancer panel. We discussed the implications of a positive, negative and/ or variant of uncertain significance genetic test result. Results should be available within approximately 3 weeks' time, at which point they will be disclosed by telephone to [facility], as will any additional recommendations warranted by these results. Courtney [name] will receive a summary of her genetic counseling visit and a copy of her results once available. This information will also be available in Epic. We encouraged Courtney [name] to remain in contact with cancer genetics annually so that we can continuously update the family history and inform her of any changes in cancer genetics and testing that may be of benefit for her family. Courtney [name]'s questions were answered to her satisfaction today. Our contact information was provided should additional questions or concerns arise.  The patient was seen for a total of 60 minutes, greater than 50% of which was spent face-to-face counseling.  This note will also be sent to the referring provider via the electronic medical record. The patient will be supplied with a summary of this genetic counseling discussion as well as educational information on the discussed hereditary cancer syndromes following the conclusion of their visit.   Patient was discussed with Dr. Marcy Panning.   _______________________________________________________________________ For Office Staff:  Number of people involved in session: 2 Was an Intern/ student involved with case: yes

## 2013-07-18 ENCOUNTER — Ambulatory Visit: Payer: BC Managed Care – PPO | Admitting: Rehabilitation

## 2013-07-19 ENCOUNTER — Ambulatory Visit: Payer: BC Managed Care – PPO | Admitting: Rehabilitation

## 2013-07-24 ENCOUNTER — Ambulatory Visit: Payer: BC Managed Care – PPO | Admitting: Rehabilitation

## 2013-07-25 ENCOUNTER — Ambulatory Visit: Payer: BC Managed Care – PPO | Admitting: Physical Therapy

## 2013-07-26 ENCOUNTER — Ambulatory Visit: Payer: BC Managed Care – PPO | Admitting: Rehabilitation

## 2013-07-30 ENCOUNTER — Ambulatory Visit: Payer: BC Managed Care – PPO | Admitting: Rehabilitation

## 2013-07-30 ENCOUNTER — Encounter: Payer: Self-pay | Admitting: Genetic Counselor

## 2013-07-30 ENCOUNTER — Telehealth: Payer: Self-pay | Admitting: Genetic Counselor

## 2013-07-30 NOTE — Telephone Encounter (Signed)
Courtney [name], genetic counseling student, under the supervision of Roma Kayser, Genetic Counselor, called Courtney [name] with negative genetic testing results.  Revealed negative results on Breast/Ovarian cancer panel.  Will send a letter with a copy of the results.

## 2013-08-01 ENCOUNTER — Ambulatory Visit: Payer: BC Managed Care – PPO | Admitting: Rehabilitation

## 2013-08-02 ENCOUNTER — Ambulatory Visit: Payer: BC Managed Care – PPO | Admitting: Rehabilitation

## 2013-08-06 ENCOUNTER — Ambulatory Visit: Payer: BC Managed Care – PPO

## 2013-08-13 ENCOUNTER — Ambulatory Visit: Payer: BC Managed Care – PPO | Attending: Orthopedic Surgery | Admitting: Rehabilitation

## 2013-08-13 DIAGNOSIS — M79609 Pain in unspecified limb: Secondary | ICD-10-CM | POA: Insufficient documentation

## 2013-08-13 DIAGNOSIS — M25669 Stiffness of unspecified knee, not elsewhere classified: Secondary | ICD-10-CM | POA: Insufficient documentation

## 2013-08-13 DIAGNOSIS — IMO0001 Reserved for inherently not codable concepts without codable children: Secondary | ICD-10-CM | POA: Insufficient documentation

## 2013-08-13 DIAGNOSIS — R262 Difficulty in walking, not elsewhere classified: Secondary | ICD-10-CM | POA: Insufficient documentation

## 2013-08-13 DIAGNOSIS — M6281 Muscle weakness (generalized): Secondary | ICD-10-CM | POA: Insufficient documentation

## 2013-08-13 DIAGNOSIS — M25569 Pain in unspecified knee: Secondary | ICD-10-CM | POA: Insufficient documentation

## 2013-08-15 ENCOUNTER — Ambulatory Visit: Payer: BC Managed Care – PPO | Admitting: Rehabilitation

## 2013-08-21 ENCOUNTER — Ambulatory Visit: Payer: BC Managed Care – PPO | Admitting: Rehabilitation

## 2013-08-23 ENCOUNTER — Ambulatory Visit: Payer: BC Managed Care – PPO | Admitting: Rehabilitation

## 2013-08-27 ENCOUNTER — Ambulatory Visit: Payer: BC Managed Care – PPO | Admitting: Physical Therapy

## 2013-08-29 ENCOUNTER — Ambulatory Visit: Payer: BC Managed Care – PPO | Admitting: Physical Therapy

## 2013-09-03 ENCOUNTER — Ambulatory Visit: Payer: BC Managed Care – PPO | Admitting: Physical Therapy

## 2013-09-05 ENCOUNTER — Ambulatory Visit: Payer: BC Managed Care – PPO | Admitting: Physical Therapy

## 2013-09-10 ENCOUNTER — Ambulatory Visit: Payer: BC Managed Care – PPO | Admitting: Rehabilitation

## 2013-09-12 ENCOUNTER — Ambulatory Visit: Payer: BC Managed Care – PPO | Attending: Orthopedic Surgery | Admitting: Rehabilitation

## 2013-09-12 DIAGNOSIS — M25669 Stiffness of unspecified knee, not elsewhere classified: Secondary | ICD-10-CM | POA: Insufficient documentation

## 2013-09-12 DIAGNOSIS — M79609 Pain in unspecified limb: Secondary | ICD-10-CM | POA: Insufficient documentation

## 2013-09-12 DIAGNOSIS — M25569 Pain in unspecified knee: Secondary | ICD-10-CM | POA: Insufficient documentation

## 2013-09-12 DIAGNOSIS — M6281 Muscle weakness (generalized): Secondary | ICD-10-CM | POA: Insufficient documentation

## 2013-09-12 DIAGNOSIS — IMO0001 Reserved for inherently not codable concepts without codable children: Secondary | ICD-10-CM | POA: Insufficient documentation

## 2013-09-12 DIAGNOSIS — R262 Difficulty in walking, not elsewhere classified: Secondary | ICD-10-CM | POA: Insufficient documentation

## 2013-09-17 ENCOUNTER — Ambulatory Visit: Payer: BC Managed Care – PPO | Admitting: Rehabilitation

## 2013-09-19 ENCOUNTER — Ambulatory Visit: Payer: BC Managed Care – PPO | Admitting: Physical Therapy

## 2013-09-24 ENCOUNTER — Encounter: Payer: BC Managed Care – PPO | Admitting: Physical Therapy

## 2013-09-25 ENCOUNTER — Encounter: Payer: BC Managed Care – PPO | Admitting: Rehabilitation

## 2013-09-25 ENCOUNTER — Ambulatory Visit: Payer: BC Managed Care – PPO | Admitting: Physical Therapy

## 2013-09-27 ENCOUNTER — Ambulatory Visit: Payer: BC Managed Care – PPO | Admitting: Physical Therapy

## 2013-10-02 ENCOUNTER — Ambulatory Visit: Payer: BC Managed Care – PPO | Admitting: Rehabilitation

## 2013-10-04 ENCOUNTER — Ambulatory Visit: Payer: BC Managed Care – PPO | Admitting: Rehabilitation

## 2013-10-08 ENCOUNTER — Ambulatory Visit: Payer: BC Managed Care – PPO | Admitting: Physical Therapy

## 2013-10-10 ENCOUNTER — Ambulatory Visit: Payer: BC Managed Care – PPO | Admitting: Rehabilitation

## 2013-10-15 ENCOUNTER — Ambulatory Visit: Payer: BC Managed Care – PPO | Attending: Orthopedic Surgery | Admitting: Physical Therapy

## 2013-10-15 DIAGNOSIS — M6281 Muscle weakness (generalized): Secondary | ICD-10-CM | POA: Insufficient documentation

## 2013-10-15 DIAGNOSIS — M25569 Pain in unspecified knee: Secondary | ICD-10-CM | POA: Insufficient documentation

## 2013-10-15 DIAGNOSIS — M79609 Pain in unspecified limb: Secondary | ICD-10-CM | POA: Insufficient documentation

## 2013-10-15 DIAGNOSIS — IMO0001 Reserved for inherently not codable concepts without codable children: Secondary | ICD-10-CM | POA: Insufficient documentation

## 2013-10-15 DIAGNOSIS — R262 Difficulty in walking, not elsewhere classified: Secondary | ICD-10-CM | POA: Insufficient documentation

## 2013-10-15 DIAGNOSIS — M25669 Stiffness of unspecified knee, not elsewhere classified: Secondary | ICD-10-CM | POA: Insufficient documentation

## 2013-10-17 ENCOUNTER — Encounter: Payer: BC Managed Care – PPO | Admitting: Physical Therapy

## 2013-10-30 ENCOUNTER — Encounter: Payer: BC Managed Care – PPO | Admitting: Physical Therapy

## 2013-11-07 ENCOUNTER — Encounter: Payer: BC Managed Care – PPO | Admitting: Rehabilitation

## 2013-11-07 ENCOUNTER — Ambulatory Visit: Payer: BC Managed Care – PPO

## 2013-11-08 ENCOUNTER — Ambulatory Visit: Payer: BC Managed Care – PPO

## 2013-11-12 ENCOUNTER — Ambulatory Visit: Payer: BC Managed Care – PPO | Attending: Orthopedic Surgery | Admitting: Physical Therapy

## 2013-11-12 DIAGNOSIS — M25569 Pain in unspecified knee: Secondary | ICD-10-CM | POA: Insufficient documentation

## 2013-11-12 DIAGNOSIS — R262 Difficulty in walking, not elsewhere classified: Secondary | ICD-10-CM | POA: Insufficient documentation

## 2013-11-12 DIAGNOSIS — M25669 Stiffness of unspecified knee, not elsewhere classified: Secondary | ICD-10-CM | POA: Insufficient documentation

## 2013-11-12 DIAGNOSIS — IMO0001 Reserved for inherently not codable concepts without codable children: Secondary | ICD-10-CM | POA: Insufficient documentation

## 2013-11-12 DIAGNOSIS — M6281 Muscle weakness (generalized): Secondary | ICD-10-CM | POA: Insufficient documentation

## 2013-11-12 DIAGNOSIS — M79609 Pain in unspecified limb: Secondary | ICD-10-CM | POA: Insufficient documentation

## 2013-11-15 ENCOUNTER — Ambulatory Visit: Payer: BC Managed Care – PPO | Admitting: Rehabilitation

## 2013-11-19 ENCOUNTER — Ambulatory Visit: Payer: BC Managed Care – PPO

## 2013-11-21 ENCOUNTER — Ambulatory Visit: Payer: BC Managed Care – PPO | Admitting: Rehabilitation

## 2013-11-22 ENCOUNTER — Ambulatory Visit: Payer: BC Managed Care – PPO

## 2013-11-26 ENCOUNTER — Ambulatory Visit: Payer: BC Managed Care – PPO | Admitting: Rehabilitation

## 2013-11-27 ENCOUNTER — Ambulatory Visit
Admission: RE | Admit: 2013-11-27 | Discharge: 2013-11-27 | Disposition: A | Discharge status: Home / Self Care | Payer: BC Managed Care – PPO | Source: Ambulatory Visit | Attending: Oncology | Admitting: Oncology

## 2013-11-27 DIAGNOSIS — Z1231 Encounter for screening mammogram for malignant neoplasm of breast: Secondary | ICD-10-CM

## 2013-12-03 ENCOUNTER — Ambulatory Visit: Payer: BC Managed Care – PPO | Admitting: Rehabilitation

## 2013-12-05 ENCOUNTER — Ambulatory Visit: Payer: BC Managed Care – PPO | Admitting: Rehabilitation

## 2013-12-13 ENCOUNTER — Ambulatory Visit: Payer: BC Managed Care – PPO | Attending: Orthopedic Surgery | Admitting: Physical Therapy

## 2013-12-13 DIAGNOSIS — M25569 Pain in unspecified knee: Secondary | ICD-10-CM | POA: Insufficient documentation

## 2013-12-13 DIAGNOSIS — M25669 Stiffness of unspecified knee, not elsewhere classified: Secondary | ICD-10-CM | POA: Insufficient documentation

## 2013-12-13 DIAGNOSIS — IMO0001 Reserved for inherently not codable concepts without codable children: Secondary | ICD-10-CM | POA: Insufficient documentation

## 2013-12-13 DIAGNOSIS — M79609 Pain in unspecified limb: Secondary | ICD-10-CM | POA: Insufficient documentation

## 2013-12-13 DIAGNOSIS — M6281 Muscle weakness (generalized): Secondary | ICD-10-CM | POA: Insufficient documentation

## 2013-12-13 DIAGNOSIS — R262 Difficulty in walking, not elsewhere classified: Secondary | ICD-10-CM | POA: Insufficient documentation

## 2013-12-17 ENCOUNTER — Ambulatory Visit: Payer: BC Managed Care – PPO | Admitting: Physical Therapy

## 2013-12-18 ENCOUNTER — Ambulatory Visit: Payer: BC Managed Care – PPO | Admitting: Rehabilitation

## 2014-03-30 ENCOUNTER — Telehealth: Payer: Self-pay | Admitting: Oncology

## 2014-03-30 NOTE — Telephone Encounter (Signed)
, °

## 2014-04-19 ENCOUNTER — Other Ambulatory Visit: Payer: Self-pay

## 2014-04-19 DIAGNOSIS — D0510 Intraductal carcinoma in situ of unspecified breast: Secondary | ICD-10-CM

## 2014-04-20 ENCOUNTER — Other Ambulatory Visit: Payer: Self-pay | Admitting: Oncology

## 2014-04-22 ENCOUNTER — Other Ambulatory Visit: Payer: BC Managed Care – PPO

## 2014-04-22 ENCOUNTER — Ambulatory Visit: Payer: BC Managed Care – PPO | Admitting: Oncology

## 2014-05-02 ENCOUNTER — Telehealth: Payer: Self-pay

## 2014-05-02 NOTE — Telephone Encounter (Signed)
Courtney [name] stated that she did not have the appointment on her phone calendar.  She has changed phones so maybe she missed transferring it. Scheduled for 06-03-14 at 0800 lab and 0830 visit.

## 2014-05-26 ENCOUNTER — Other Ambulatory Visit: Payer: Self-pay | Admitting: Oncology

## 2014-05-26 DIAGNOSIS — D0511 Intraductal carcinoma in situ of right breast: Secondary | ICD-10-CM

## 2014-06-03 ENCOUNTER — Telehealth: Payer: Self-pay | Admitting: Oncology

## 2014-06-03 ENCOUNTER — Encounter: Payer: Self-pay | Admitting: Oncology

## 2014-06-03 ENCOUNTER — Ambulatory Visit (HOSPITAL_BASED_OUTPATIENT_CLINIC_OR_DEPARTMENT_OTHER): Payer: BC Managed Care – PPO | Admitting: Oncology

## 2014-06-03 ENCOUNTER — Ambulatory Visit (HOSPITAL_BASED_OUTPATIENT_CLINIC_OR_DEPARTMENT_OTHER): Payer: BC Managed Care – PPO | Admitting: Lab

## 2014-06-03 VITALS — BP 160/97 | HR 73 | Temp 98.3°F | Resp 20 | Ht 64.5 in | Wt 310.6 lb

## 2014-06-03 DIAGNOSIS — Z853 Personal history of malignant neoplasm of breast: Secondary | ICD-10-CM

## 2014-06-03 DIAGNOSIS — D0510 Intraductal carcinoma in situ of unspecified breast: Secondary | ICD-10-CM

## 2014-06-03 DIAGNOSIS — L739 Follicular disorder, unspecified: Secondary | ICD-10-CM | POA: Insufficient documentation

## 2014-06-03 DIAGNOSIS — D0511 Intraductal carcinoma in situ of right breast: Secondary | ICD-10-CM

## 2014-06-03 DIAGNOSIS — Z1231 Encounter for screening mammogram for malignant neoplasm of breast: Secondary | ICD-10-CM

## 2014-06-03 HISTORY — DX: Follicular disorder, unspecified: L73.9

## 2014-06-03 LAB — COMPREHENSIVE METABOLIC PANEL (CC13)
ALBUMIN: 3.3 g/dL — AB (ref 3.5–5.0)
ALK PHOS: 102 U/L (ref 40–150)
ALT: 22 U/L (ref 0–55)
AST: 20 U/L (ref 5–34)
Anion Gap: 9 mEq/L (ref 3–11)
BUN: 9.2 mg/dL (ref 7.0–26.0)
CO2: 27 mEq/L (ref 22–29)
CREATININE: 0.9 mg/dL (ref 0.6–1.1)
Calcium: 9.2 mg/dL (ref 8.4–10.4)
Chloride: 105 mEq/L (ref 98–109)
EGFR: 85 mL/min/{1.73_m2} — AB (ref >90)
GLUCOSE: 103 mg/dL (ref 70–140)
Potassium: 4 mEq/L (ref 3.5–5.1)
Sodium: 141 mEq/L (ref 136–145)
Total Bilirubin: 0.6 mg/dL (ref 0.20–1.20)
Total Protein: 7.2 g/dL (ref 6.4–8.3)

## 2014-06-03 LAB — CBC WITH DIFFERENTIAL/PLATELET
BASO%: 0.4 % (ref 0.0–2.0)
Basophils Absolute: 0 10*3/uL (ref 0.0–0.1)
EOS%: 25.6 % — AB (ref 0.0–7.0)
Eosinophils Absolute: 2.5 10*3/uL — ABNORMAL HIGH (ref 0.0–0.5)
HCT: 35.9 % (ref 34.8–46.6)
HEMOGLOBIN: 11.6 g/dL (ref 11.6–15.9)
LYMPH#: 3 10*3/uL (ref 0.9–3.3)
LYMPH%: 31.3 % (ref 14.0–49.7)
MCH: 29.1 pg (ref 25.1–34.0)
MCHC: 32.3 g/dL (ref 31.5–36.0)
MCV: 90.2 fL (ref 79.5–101.0)
MONO#: 0.6 10*3/uL (ref 0.1–0.9)
MONO%: 6.2 % (ref 0.0–14.0)
NEUT#: 3.5 10*3/uL (ref 1.5–6.5)
NEUT%: 36.5 % — AB (ref 38.4–76.8)
Platelets: 290 10*3/uL (ref 145–400)
RBC: 3.98 10*6/uL (ref 3.70–5.45)
RDW: 14.3 % (ref 11.2–14.5)
WBC: 9.6 10*3/uL (ref 3.9–10.3)
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
## 2014-06-03 NOTE — Telephone Encounter (Signed)
, °

## 2014-11-29 ENCOUNTER — Ambulatory Visit
Admission: RE | Admit: 2014-11-29 | Discharge: 2014-11-29 | Disposition: A | Discharge status: Home / Self Care | Payer: BLUE CROSS/BLUE SHIELD | Source: Ambulatory Visit | Attending: Oncology | Admitting: Oncology

## 2014-11-29 DIAGNOSIS — Z1231 Encounter for screening mammogram for malignant neoplasm of breast: Secondary | ICD-10-CM

## 2016-03-16 ENCOUNTER — Other Ambulatory Visit: Payer: Self-pay | Admitting: Family Medicine

## 2016-03-16 DIAGNOSIS — Z1231 Encounter for screening mammogram for malignant neoplasm of breast: Secondary | ICD-10-CM

## 2016-03-22 ENCOUNTER — Inpatient Hospital Stay: Admission: RE | Admit: 2016-03-22 | Payer: BLUE CROSS/BLUE SHIELD | Source: Ambulatory Visit

## 2016-06-01 ENCOUNTER — Ambulatory Visit: Payer: BLUE CROSS/BLUE SHIELD

## 2016-06-03 ENCOUNTER — Ambulatory Visit
Admission: RE | Admit: 2016-06-03 | Discharge: 2016-06-03 | Disposition: A | Discharge status: Home / Self Care | Payer: BLUE CROSS/BLUE SHIELD | Source: Ambulatory Visit | Attending: Family Medicine | Admitting: Family Medicine

## 2016-06-03 DIAGNOSIS — Z1231 Encounter for screening mammogram for malignant neoplasm of breast: Secondary | ICD-10-CM

## 2016-07-20 DIAGNOSIS — J329 Chronic sinusitis, unspecified: Secondary | ICD-10-CM | POA: Diagnosis not present

## 2016-07-30 DIAGNOSIS — C50911 Malignant neoplasm of unspecified site of right female breast: Secondary | ICD-10-CM | POA: Diagnosis not present

## 2016-07-30 DIAGNOSIS — Z9012 Acquired absence of left breast and nipple: Secondary | ICD-10-CM | POA: Diagnosis not present

## 2016-10-04 DIAGNOSIS — R51 Headache: Secondary | ICD-10-CM | POA: Diagnosis not present

## 2016-10-04 DIAGNOSIS — R0981 Nasal congestion: Secondary | ICD-10-CM | POA: Diagnosis not present

## 2016-10-04 DIAGNOSIS — J302 Other seasonal allergic rhinitis: Secondary | ICD-10-CM | POA: Diagnosis not present

## 2016-10-12 DIAGNOSIS — C50911 Malignant neoplasm of unspecified site of right female breast: Secondary | ICD-10-CM | POA: Diagnosis not present

## 2016-12-21 ENCOUNTER — Ambulatory Visit
Admission: RE | Admit: 2016-12-21 | Discharge: 2016-12-21 | Disposition: A | Discharge status: Home / Self Care | Payer: BLUE CROSS/BLUE SHIELD | Source: Ambulatory Visit | Attending: Family Medicine | Admitting: Family Medicine

## 2016-12-21 ENCOUNTER — Other Ambulatory Visit: Payer: Self-pay | Admitting: Family Medicine

## 2016-12-21 DIAGNOSIS — J321 Chronic frontal sinusitis: Secondary | ICD-10-CM | POA: Diagnosis not present

## 2016-12-21 DIAGNOSIS — H938X9 Other specified disorders of ear, unspecified ear: Secondary | ICD-10-CM

## 2016-12-21 DIAGNOSIS — J329 Chronic sinusitis, unspecified: Secondary | ICD-10-CM

## 2016-12-21 DIAGNOSIS — R0981 Nasal congestion: Secondary | ICD-10-CM | POA: Diagnosis not present

## 2017-01-28 DIAGNOSIS — D509 Iron deficiency anemia, unspecified: Secondary | ICD-10-CM | POA: Diagnosis not present

## 2017-01-28 DIAGNOSIS — Z0000 Encounter for general adult medical examination without abnormal findings: Secondary | ICD-10-CM | POA: Diagnosis not present

## 2017-01-28 DIAGNOSIS — I10 Essential (primary) hypertension: Secondary | ICD-10-CM | POA: Diagnosis not present

## 2017-01-28 DIAGNOSIS — E785 Hyperlipidemia, unspecified: Secondary | ICD-10-CM | POA: Diagnosis not present

## 2017-03-24 DIAGNOSIS — Z1151 Encounter for screening for human papillomavirus (HPV): Secondary | ICD-10-CM | POA: Diagnosis not present

## 2017-03-24 DIAGNOSIS — Z01419 Encounter for gynecological examination (general) (routine) without abnormal findings: Secondary | ICD-10-CM | POA: Diagnosis not present

## 2017-03-24 DIAGNOSIS — Z6841 Body Mass Index (BMI) 40.0 and over, adult: Secondary | ICD-10-CM | POA: Diagnosis not present

## 2017-03-24 DIAGNOSIS — Z1231 Encounter for screening mammogram for malignant neoplasm of breast: Secondary | ICD-10-CM | POA: Diagnosis not present

## 2017-04-22 DIAGNOSIS — T8332XA Displacement of intrauterine contraceptive device, initial encounter: Secondary | ICD-10-CM | POA: Diagnosis not present

## 2017-04-25 DIAGNOSIS — J069 Acute upper respiratory infection, unspecified: Secondary | ICD-10-CM | POA: Diagnosis not present

## 2017-05-19 DIAGNOSIS — S56912A Strain of unspecified muscles, fascia and tendons at forearm level, left arm, initial encounter: Secondary | ICD-10-CM | POA: Diagnosis not present

## 2017-07-20 DIAGNOSIS — M79641 Pain in right hand: Secondary | ICD-10-CM | POA: Diagnosis not present

## 2017-07-29 DIAGNOSIS — R52 Pain, unspecified: Secondary | ICD-10-CM | POA: Diagnosis not present

## 2017-07-29 DIAGNOSIS — M65311 Trigger thumb, right thumb: Secondary | ICD-10-CM | POA: Diagnosis not present

## 2017-09-12 DIAGNOSIS — R252 Cramp and spasm: Secondary | ICD-10-CM | POA: Diagnosis not present

## 2017-09-12 DIAGNOSIS — M719 Bursopathy, unspecified: Secondary | ICD-10-CM | POA: Diagnosis not present

## 2017-09-28 DIAGNOSIS — M545 Low back pain: Secondary | ICD-10-CM | POA: Diagnosis not present

## 2017-09-28 DIAGNOSIS — M25552 Pain in left hip: Secondary | ICD-10-CM | POA: Diagnosis not present

## 2017-10-06 DIAGNOSIS — S335XXD Sprain of ligaments of lumbar spine, subsequent encounter: Secondary | ICD-10-CM | POA: Diagnosis not present

## 2017-10-06 DIAGNOSIS — M79604 Pain in right leg: Secondary | ICD-10-CM | POA: Diagnosis not present

## 2017-10-06 DIAGNOSIS — M79605 Pain in left leg: Secondary | ICD-10-CM | POA: Diagnosis not present

## 2017-10-12 DIAGNOSIS — M79604 Pain in right leg: Secondary | ICD-10-CM | POA: Diagnosis not present

## 2017-10-12 DIAGNOSIS — M79605 Pain in left leg: Secondary | ICD-10-CM | POA: Diagnosis not present

## 2017-10-12 DIAGNOSIS — S335XXD Sprain of ligaments of lumbar spine, subsequent encounter: Secondary | ICD-10-CM | POA: Diagnosis not present

## 2017-10-14 DIAGNOSIS — S335XXD Sprain of ligaments of lumbar spine, subsequent encounter: Secondary | ICD-10-CM | POA: Diagnosis not present

## 2017-10-14 DIAGNOSIS — M79605 Pain in left leg: Secondary | ICD-10-CM | POA: Diagnosis not present

## 2017-10-14 DIAGNOSIS — M79604 Pain in right leg: Secondary | ICD-10-CM | POA: Diagnosis not present

## 2017-10-19 DIAGNOSIS — M79604 Pain in right leg: Secondary | ICD-10-CM | POA: Diagnosis not present

## 2017-10-19 DIAGNOSIS — S335XXD Sprain of ligaments of lumbar spine, subsequent encounter: Secondary | ICD-10-CM | POA: Diagnosis not present

## 2017-10-19 DIAGNOSIS — M79605 Pain in left leg: Secondary | ICD-10-CM | POA: Diagnosis not present

## 2017-10-24 DIAGNOSIS — S335XXD Sprain of ligaments of lumbar spine, subsequent encounter: Secondary | ICD-10-CM | POA: Diagnosis not present

## 2017-10-24 DIAGNOSIS — M79604 Pain in right leg: Secondary | ICD-10-CM | POA: Diagnosis not present

## 2017-10-24 DIAGNOSIS — M79605 Pain in left leg: Secondary | ICD-10-CM | POA: Diagnosis not present

## 2017-10-26 DIAGNOSIS — M79604 Pain in right leg: Secondary | ICD-10-CM | POA: Diagnosis not present

## 2017-10-26 DIAGNOSIS — M79605 Pain in left leg: Secondary | ICD-10-CM | POA: Diagnosis not present

## 2017-10-26 DIAGNOSIS — M545 Low back pain: Secondary | ICD-10-CM | POA: Diagnosis not present

## 2017-10-26 DIAGNOSIS — S335XXD Sprain of ligaments of lumbar spine, subsequent encounter: Secondary | ICD-10-CM | POA: Diagnosis not present

## 2018-02-03 DIAGNOSIS — D509 Iron deficiency anemia, unspecified: Secondary | ICD-10-CM | POA: Diagnosis not present

## 2018-02-03 DIAGNOSIS — I10 Essential (primary) hypertension: Secondary | ICD-10-CM | POA: Diagnosis not present

## 2018-02-03 DIAGNOSIS — E785 Hyperlipidemia, unspecified: Secondary | ICD-10-CM | POA: Diagnosis not present

## 2018-02-03 DIAGNOSIS — Z0000 Encounter for general adult medical examination without abnormal findings: Secondary | ICD-10-CM | POA: Diagnosis not present

## 2018-06-23 DIAGNOSIS — C50911 Malignant neoplasm of unspecified site of right female breast: Secondary | ICD-10-CM | POA: Diagnosis not present

## 2018-06-23 DIAGNOSIS — Z9012 Acquired absence of left breast and nipple: Secondary | ICD-10-CM | POA: Diagnosis not present

## 2018-07-24 DIAGNOSIS — Z6841 Body Mass Index (BMI) 40.0 and over, adult: Secondary | ICD-10-CM | POA: Diagnosis not present

## 2018-07-24 DIAGNOSIS — Z01419 Encounter for gynecological examination (general) (routine) without abnormal findings: Secondary | ICD-10-CM | POA: Diagnosis not present

## 2018-07-24 DIAGNOSIS — Z1231 Encounter for screening mammogram for malignant neoplasm of breast: Secondary | ICD-10-CM | POA: Diagnosis not present

## 2018-08-07 DIAGNOSIS — E785 Hyperlipidemia, unspecified: Secondary | ICD-10-CM | POA: Diagnosis not present

## 2018-08-07 DIAGNOSIS — I10 Essential (primary) hypertension: Secondary | ICD-10-CM | POA: Diagnosis not present

## 2018-08-14 DIAGNOSIS — J069 Acute upper respiratory infection, unspecified: Secondary | ICD-10-CM | POA: Diagnosis not present

## 2018-08-18 DIAGNOSIS — J069 Acute upper respiratory infection, unspecified: Secondary | ICD-10-CM | POA: Diagnosis not present

## 2018-12-04 ENCOUNTER — Other Ambulatory Visit: Payer: Self-pay | Admitting: *Deleted

## 2018-12-04 DIAGNOSIS — Z20822 Contact with and (suspected) exposure to covid-19: Secondary | ICD-10-CM

## 2018-12-10 LAB — NOVEL CORONAVIRUS, NAA: SARS-CoV-2, NAA: NOT DETECTED

## 2019-01-08 ENCOUNTER — Other Ambulatory Visit: Payer: Self-pay | Admitting: *Deleted

## 2019-01-08 DIAGNOSIS — Z20822 Contact with and (suspected) exposure to covid-19: Secondary | ICD-10-CM

## 2019-01-10 DIAGNOSIS — R6889 Other general symptoms and signs: Secondary | ICD-10-CM | POA: Diagnosis not present

## 2019-01-12 LAB — NOVEL CORONAVIRUS, NAA: SARS-CoV-2, NAA: NOT DETECTED

## 2019-02-21 DIAGNOSIS — E785 Hyperlipidemia, unspecified: Secondary | ICD-10-CM | POA: Diagnosis not present

## 2019-02-21 DIAGNOSIS — D509 Iron deficiency anemia, unspecified: Secondary | ICD-10-CM | POA: Diagnosis not present

## 2019-02-21 DIAGNOSIS — Z0000 Encounter for general adult medical examination without abnormal findings: Secondary | ICD-10-CM | POA: Diagnosis not present

## 2019-02-21 DIAGNOSIS — I10 Essential (primary) hypertension: Secondary | ICD-10-CM | POA: Diagnosis not present

## 2019-03-02 DIAGNOSIS — R208 Other disturbances of skin sensation: Secondary | ICD-10-CM | POA: Diagnosis not present

## 2019-03-02 DIAGNOSIS — L821 Other seborrheic keratosis: Secondary | ICD-10-CM | POA: Diagnosis not present

## 2019-03-02 DIAGNOSIS — L918 Other hypertrophic disorders of the skin: Secondary | ICD-10-CM | POA: Diagnosis not present

## 2019-03-29 ENCOUNTER — Other Ambulatory Visit: Payer: Self-pay

## 2019-03-29 DIAGNOSIS — Z20822 Contact with and (suspected) exposure to covid-19: Secondary | ICD-10-CM

## 2019-03-29 DIAGNOSIS — Z20828 Contact with and (suspected) exposure to other viral communicable diseases: Secondary | ICD-10-CM | POA: Diagnosis not present

## 2019-03-31 LAB — NOVEL CORONAVIRUS, NAA: SARS-CoV-2, NAA: NOT DETECTED

## 2019-05-02 DIAGNOSIS — N939 Abnormal uterine and vaginal bleeding, unspecified: Secondary | ICD-10-CM | POA: Diagnosis not present

## 2019-05-17 DIAGNOSIS — H40051 Ocular hypertension, right eye: Secondary | ICD-10-CM | POA: Diagnosis not present

## 2019-07-10 ENCOUNTER — Ambulatory Visit: Payer: BC Managed Care – PPO | Attending: Internal Medicine

## 2019-07-10 DIAGNOSIS — Z20822 Contact with and (suspected) exposure to covid-19: Secondary | ICD-10-CM

## 2019-07-11 LAB — NOVEL CORONAVIRUS, NAA: SARS-CoV-2, NAA: NOT DETECTED

## 2019-08-16 DIAGNOSIS — H40011 Open angle with borderline findings, low risk, right eye: Secondary | ICD-10-CM | POA: Diagnosis not present

## 2019-08-24 DIAGNOSIS — G479 Sleep disorder, unspecified: Secondary | ICD-10-CM | POA: Diagnosis not present

## 2019-08-24 DIAGNOSIS — D509 Iron deficiency anemia, unspecified: Secondary | ICD-10-CM | POA: Diagnosis not present

## 2019-08-24 DIAGNOSIS — E785 Hyperlipidemia, unspecified: Secondary | ICD-10-CM | POA: Diagnosis not present

## 2019-08-24 DIAGNOSIS — I10 Essential (primary) hypertension: Secondary | ICD-10-CM | POA: Diagnosis not present

## 2019-09-28 DIAGNOSIS — Z01419 Encounter for gynecological examination (general) (routine) without abnormal findings: Secondary | ICD-10-CM | POA: Diagnosis not present

## 2019-09-28 DIAGNOSIS — Z1151 Encounter for screening for human papillomavirus (HPV): Secondary | ICD-10-CM | POA: Diagnosis not present

## 2019-09-28 DIAGNOSIS — Z6841 Body Mass Index (BMI) 40.0 and over, adult: Secondary | ICD-10-CM | POA: Diagnosis not present

## 2019-10-25 DIAGNOSIS — Z1231 Encounter for screening mammogram for malignant neoplasm of breast: Secondary | ICD-10-CM | POA: Diagnosis not present

## 2020-01-08 DIAGNOSIS — N61 Mastitis without abscess: Secondary | ICD-10-CM | POA: Diagnosis not present

## 2020-01-08 DIAGNOSIS — N6320 Unspecified lump in the left breast, unspecified quadrant: Secondary | ICD-10-CM | POA: Diagnosis not present

## 2020-01-10 ENCOUNTER — Other Ambulatory Visit: Payer: Self-pay | Admitting: Obstetrics & Gynecology

## 2020-01-10 DIAGNOSIS — N63 Unspecified lump in unspecified breast: Secondary | ICD-10-CM

## 2020-01-14 ENCOUNTER — Ambulatory Visit
Admission: RE | Admit: 2020-01-14 | Discharge: 2020-01-14 | Disposition: A | Discharge status: Home / Self Care | Payer: BC Managed Care – PPO | Source: Ambulatory Visit | Attending: Obstetrics & Gynecology | Admitting: Obstetrics & Gynecology

## 2020-01-14 ENCOUNTER — Other Ambulatory Visit: Payer: Self-pay

## 2020-01-14 ENCOUNTER — Other Ambulatory Visit: Payer: Self-pay | Admitting: General Surgery

## 2020-01-14 DIAGNOSIS — N6320 Unspecified lump in the left breast, unspecified quadrant: Secondary | ICD-10-CM | POA: Diagnosis not present

## 2020-01-14 DIAGNOSIS — N63 Unspecified lump in unspecified breast: Secondary | ICD-10-CM

## 2020-01-14 DIAGNOSIS — R928 Other abnormal and inconclusive findings on diagnostic imaging of breast: Secondary | ICD-10-CM | POA: Diagnosis not present

## 2020-01-14 DIAGNOSIS — N6082 Other benign mammary dysplasias of left breast: Secondary | ICD-10-CM | POA: Diagnosis not present

## 2020-02-11 DIAGNOSIS — N6320 Unspecified lump in the left breast, unspecified quadrant: Secondary | ICD-10-CM | POA: Diagnosis not present

## 2020-02-19 NOTE — Progress Notes (Signed)
Your procedure is scheduled on Wednesday, February 27, 2020.  Report to [facility] Main Entrance "A" at 7:00 A.M., and check in at the Admitting office.  Call this number if you have problems the morning of surgery:  [phone]  Call [phone] if you have any questions prior to your surgery date Monday-Friday 8am-4pm    Remember:  Do not eat after midnight the night before your surgery  You may drink clear liquids until 6:00 AM the morning of your surgery.   Clear liquids allowed are: Water, Non-Citrus Juices (without pulp), Carbonated Beverages, Clear Tea, Black Coffee Only, and Gatorade    Take these medicines the morning of surgery with A SIP OF WATER:   NONE  As of today, STOP taking any Aspirin (unless otherwise instructed by your surgeon) Aleve, Naproxen, Ibuprofen, Motrin, Advil, Goody's, BC's, all herbal medications, fish oil, and all vitamins.                      Do not wear jewelry, make up, or nail polish            Do not wear lotions, powders, perfumes, or deodorant.            Do not shave 48 hours prior to surgery.            Do not bring valuables to the hospital.            [facility] is not responsible for any belongings or valuables.  Do NOT Smoke (Tobacco/Vaping) or drink Alcohol 24 hours prior to your procedure If you use a CPAP at night, you may bring all equipment for your overnight stay.   Contacts, glasses, dentures or bridgework may not be worn into surgery.      For patients admitted to the hospital, discharge time will be determined by your treatment team.   Patients discharged the day of surgery will not be allowed to drive home, and someone needs to stay with them for 24 hours.    Special instructions:   [facility]- Preparing For Surgery  Before surgery, you can play an important role. Because skin is not sterile, your skin needs to be as free of germs as possible. You can reduce the number of germs on your skin by washing with CHG  (chlorahexidine gluconate) Soap before surgery.  CHG is an antiseptic cleaner which kills germs and bonds with the skin to continue killing germs even after washing.    Oral Hygiene is also important to reduce your risk of infection.  Remember - BRUSH YOUR TEETH THE MORNING OF SURGERY WITH YOUR REGULAR TOOTHPASTE  Please do not use if you have an allergy to CHG or antibacterial soaps. If your skin becomes reddened/irritated stop using the CHG.  Do not shave (including legs and underarms) for at least 48 hours prior to first CHG shower. It is OK to shave your face.  Please follow these instructions carefully.   1. Shower the NIGHT BEFORE SURGERY and the MORNING OF SURGERY with CHG Soap.   2. If you chose to wash your hair, wash your hair first as usual with your normal shampoo.  3. After you shampoo, rinse your hair and body thoroughly to remove the shampoo.  4. Use CHG as you would any other liquid soap. You can apply CHG directly to the skin and wash gently with a scrungie or a clean washcloth.   5. Apply the CHG Soap to your body ONLY  FROM THE NECK DOWN.  Do not use on open wounds or open sores. Avoid contact with your eyes, ears, mouth and genitals (private parts). Wash Face and genitals (private parts)  with your normal soap.   6. Wash thoroughly, paying special attention to the area where your surgery will be performed.  7. Thoroughly rinse your body with warm water from the neck down.  8. DO NOT shower/wash with your normal soap after using and rinsing off the CHG Soap.  9. Pat yourself dry with a CLEAN TOWEL.  10. Wear CLEAN PAJAMAS to bed the night before surgery  11. Place CLEAN SHEETS on your bed the night of your first shower and DO NOT SLEEP WITH PETS.   Day of Surgery: Wear Clean/Comfortable clothing the morning of surgery Do not apply any deodorants/lotions.   Remember to brush your teeth WITH YOUR REGULAR TOOTHPASTE.   Please read over the following fact sheets  that you were given.

## 2020-02-20 ENCOUNTER — Encounter (HOSPITAL_COMMUNITY)
Admission: RE | Admit: 2020-02-20 | Discharge: 2020-02-20 | Disposition: A | Discharge status: Home / Self Care | Payer: BC Managed Care – PPO | Source: Ambulatory Visit | Attending: General Surgery | Admitting: General Surgery

## 2020-02-20 ENCOUNTER — Encounter (HOSPITAL_COMMUNITY): Payer: Self-pay

## 2020-02-20 ENCOUNTER — Other Ambulatory Visit: Payer: Self-pay

## 2020-02-20 DIAGNOSIS — Z01818 Encounter for other preprocedural examination: Secondary | ICD-10-CM | POA: Diagnosis not present

## 2020-02-20 DIAGNOSIS — I10 Essential (primary) hypertension: Secondary | ICD-10-CM | POA: Diagnosis not present

## 2020-02-20 HISTORY — DX: Gastro-esophageal reflux disease without esophagitis: K21.9

## 2020-02-20 LAB — BASIC METABOLIC PANEL
Anion gap: 11 (ref 5–15)
BUN: 8 mg/dL (ref 6–20)
CO2: 23 mmol/L (ref 22–32)
Calcium: 9.7 mg/dL (ref 8.9–10.3)
Chloride: 102 mmol/L (ref 98–111)
Creatinine, Ser: 0.92 mg/dL (ref 0.44–1.00)
GFR calc Af Amer: >60 mL/min (ref >60)
GFR calc non Af Amer: >60 mL/min (ref >60)
Glucose, Bld: 94 mg/dL (ref 70–99)
Potassium: 3.8 mmol/L (ref 3.5–5.1)
Sodium: 136 mmol/L (ref 135–145)

## 2020-02-20 LAB — CBC
HCT: 40.2 % (ref 36.0–46.0)
Hemoglobin: 12.8 g/dL (ref 12.0–15.0)
MCH: 30.1 pg (ref 26.0–34.0)
MCHC: 31.8 g/dL (ref 30.0–36.0)
MCV: 94.6 fL (ref 80.0–100.0)
Platelets: 356 10*3/uL (ref 150–400)
RBC: 4.25 MIL/uL (ref 3.87–5.11)
RDW: 13.5 % (ref 11.5–15.5)
WBC: 8.5 10*3/uL (ref 4.0–10.5)
nRBC: 0 % (ref 0.0–0.2)

## 2020-02-20 NOTE — Progress Notes (Signed)
PCP - Dr. Kelton Pillar Cardiologist - denies  PPM/ICD - denies  Chest x-ray - N/A EKG - 02/20/2020 Stress Test - denies ECHO - denies Cardiac Cath - denies  Sleep Study - denies CPAP - N/A  DM: denies  Blood Thinner Instructions: N/A Aspirin Instructions: N/A  ERAS Protcol - Yes PRE-SURGERY Ensure or G2- Ensure given  COVID TEST- Scheduled for 02/23/2020. Patient verbalized understanding of self-quarantine instructions, appointment time and place.  Anesthesia review: No  Patient denies shortness of breath, fever, cough and chest pain at PAT appointment  All instructions explained to the patient, with a verbal understanding of the material. Patient agrees to go over the instructions while at home for a better understanding. Patient also instructed to self quarantine after being tested for COVID-19. The opportunity to ask questions was provided.

## 2020-02-23 ENCOUNTER — Other Ambulatory Visit (HOSPITAL_COMMUNITY)
Admission: RE | Admit: 2020-02-23 | Discharge: 2020-02-23 | Disposition: A | Discharge status: Home / Self Care | Payer: BC Managed Care – PPO | Source: Ambulatory Visit | Attending: General Surgery | Admitting: General Surgery

## 2020-02-23 DIAGNOSIS — Z01818 Encounter for other preprocedural examination: Secondary | ICD-10-CM | POA: Diagnosis not present

## 2020-02-23 DIAGNOSIS — Z20822 Contact with and (suspected) exposure to covid-19: Secondary | ICD-10-CM | POA: Diagnosis not present

## 2020-02-23 LAB — SARS CORONAVIRUS 2 (TAT 6-24 HRS): SARS Coronavirus 2: NEGATIVE

## 2020-02-26 MED ORDER — DEXTROSE 5 % IV SOLN
3.0000 g | INTRAVENOUS | Status: AC
  Administered 2020-02-27: 3 g via INTRAVENOUS
  Filled 2020-02-26: qty 3

## 2020-02-26 NOTE — H&P (Signed)
Courtney [name] Appointment: 02/11/2020 12:00 PM Location: [facility] Patient #: [id number] DOB: 11-23-1970 Married / Language: English / Race: Black or African American Female   History of Present Illness Courtney Klein MD; 02/11/2020 7:14 PM) The patient is a 49 year old female who presents for a follow-up for Breast mass. Pt is a 49 yo F who was referred for consultation by Dr. Benjie Karvonen for a new dx of right breast cellulitis/mass. She is s/p right mastectomy 2004 for right breast cancer. This was found when she was getting worked up for a bilateral breast reduction. (not sure what nodal surgery she had). She had right mastectomy with TRAM recon by Dr. Harlow Mares. She had subsequent left breast reduction. This was followed by tamoxifen for 5 years. Over the past few years, she had a small mass pop up under her left breast near the inframammary scar. This got red and painful. She had it opened once before and "it was horrible." She said it waxed and waned in size, and it drained on its own once. She had a mammogram at [facility] in may that was negative.   over the last 4 weeks, the mass has gone down. It did not drain. It is no longer sore. She desires resection to avoid this scenario in the future.   left mammogram/us 01/14/2020 CLINICAL DATA: 49 year old female with a palpable area of concern in the inner left breast involving the skin. Patient recently started a course of antibiotics approximately 3 days prior. Prior right mastectomy 2004 subsequent right tram flap and left breast reduction mammoplasty. Patient has had history of draining infection in this location over year prior as well as remotely many years ago.  EXAM: DIGITAL DIAGNOSTIC UNILATERAL LEFT MAMMOGRAM WITH TOMO AND CAD; ULTRASOUND LEFT BREAST LIMITED  COMPARISON: Previous exams.  ACR Breast Density Category b: There are scattered areas of fibroglandular density.  FINDINGS: Spot compression  tangential tomograms were performed over the palpable area of concern in the lower inner left breast demonstrating an oval mass abutting the skin measuring approximately 3.5 cm.  Mammographic images were processed with CAD.  Physical examination reveals a raised erythematous slightly fluctuant mass involving the skin of the lower inner left breast. There is no purulent drainage. The patient is mildly tender to palpation.  Targeted ultrasound of the left breast was performed. There is a heterogeneous mass/collection within the skin of the left breast at 8 o'clock 10 cm from nipple measuring 3.9 x 0.7 x 2.5 cm. Findings are most consistent with an infected/inflamed sebaceous cyst.  IMPRESSION: 3.9 cm infected/inflamed sebaceous cyst at site of palpable concern in the lower inner left breast.  RECOMMENDATION: 1. The patient is scheduled to see a surgeon later this afternoon for further evaluation. She states she would like this area excised, and declines attempt at ultrasound-guided aspiration.  2. Screening mammogram of the left breast in one year.(Code:SM-B-01Y)  I have discussed the findings and recommendations with the patient. If applicable, a reminder letter will be sent to the patient regarding the next appointment.  BI-RADS CATEGORY 2: Benign.    Records were found in epic from 2012 and 2013 from Drs. Rosenbower and [employer].   Allergies Emeline Gins, [place]; 02/11/2020 12:23 PM) No Known Drug Allergies  [01/14/2020]: Allergies Reconciled   Medication History Emeline Gins, CMA; 02/11/2020 12:23 PM) Lisinopril-hydroCHLOROthiazide (20-25MG  Tablet, Oral) Active. Medications Reconciled    Review of Systems Courtney Klein MD; 02/11/2020 7:15 PM) All other systems negative  Vitals Emeline Gins  CMA; 02/11/2020 12:23 PM) 02/11/2020 12:23 PM Weight: 300.4 lb Height: 65in Body Surface Area: 2.35 m Body Mass Index: 49.99 kg/m  Temp.: 97.41F   Pulse: 87 (Regular)  BP: 124/82(Sitting, Left Arm, Standard)       Physical Exam Courtney Klein MD; 02/11/2020 7:15 PM) Breast Note: sebaceous cyst on left breast has diminished greatly in size. It is non tender and no erythema is present.     Assessment & Plan Courtney Klein MD; 02/11/2020 7:16 PM) LEFT BREAST MASS (N63.20) Impression: We plan excision in a few weeks in the OR.  I will plan to widely excise the tissue around the prior scar to minimize risk of recurrent cyst.  reviewed risks and recovery. Current Plans Instructed to keep follow-up appointment as scheduled

## 2020-02-27 ENCOUNTER — Ambulatory Visit (HOSPITAL_COMMUNITY)
Admission: RE | Admit: 2020-02-27 | Discharge: 2020-02-27 | Disposition: A | Discharge status: Home / Self Care | Payer: BC Managed Care – PPO | Source: Ambulatory Visit | Attending: General Surgery | Admitting: General Surgery

## 2020-02-27 ENCOUNTER — Ambulatory Visit (HOSPITAL_COMMUNITY): Payer: BC Managed Care – PPO | Admitting: Certified Registered Nurse Anesthetist

## 2020-02-27 ENCOUNTER — Encounter (HOSPITAL_COMMUNITY)
Admission: RE | Disposition: A | Discharge status: Home / Self Care | Payer: Self-pay | Source: Ambulatory Visit | Attending: General Surgery

## 2020-02-27 ENCOUNTER — Encounter (HOSPITAL_COMMUNITY): Payer: Self-pay | Admitting: General Surgery

## 2020-02-27 ENCOUNTER — Other Ambulatory Visit: Payer: Self-pay

## 2020-02-27 DIAGNOSIS — N6320 Unspecified lump in the left breast, unspecified quadrant: Secondary | ICD-10-CM | POA: Diagnosis not present

## 2020-02-27 DIAGNOSIS — Z853 Personal history of malignant neoplasm of breast: Secondary | ICD-10-CM | POA: Diagnosis not present

## 2020-02-27 DIAGNOSIS — Z9011 Acquired absence of right breast and nipple: Secondary | ICD-10-CM | POA: Diagnosis not present

## 2020-02-27 DIAGNOSIS — Z6841 Body Mass Index (BMI) 40.0 and over, adult: Secondary | ICD-10-CM | POA: Insufficient documentation

## 2020-02-27 DIAGNOSIS — N611 Abscess of the breast and nipple: Secondary | ICD-10-CM | POA: Diagnosis not present

## 2020-02-27 DIAGNOSIS — K219 Gastro-esophageal reflux disease without esophagitis: Secondary | ICD-10-CM | POA: Diagnosis not present

## 2020-02-27 DIAGNOSIS — L02211 Cutaneous abscess of abdominal wall: Secondary | ICD-10-CM | POA: Diagnosis not present

## 2020-02-27 DIAGNOSIS — D649 Anemia, unspecified: Secondary | ICD-10-CM | POA: Diagnosis not present

## 2020-02-27 DIAGNOSIS — I10 Essential (primary) hypertension: Secondary | ICD-10-CM | POA: Insufficient documentation

## 2020-02-27 HISTORY — PX: BREAST CYST EXCISION: SHX579

## 2020-02-27 LAB — POCT PREGNANCY, URINE: Preg Test, Ur: NEGATIVE

## 2020-02-27 SURGERY — EXCISION, CYST, BREAST
Anesthesia: General | Site: Breast | Laterality: Left

## 2020-02-27 MED ORDER — PHENYLEPHRINE 40 MCG/ML (10ML) SYRINGE FOR IV PUSH (FOR BLOOD PRESSURE SUPPORT)
PREFILLED_SYRINGE | INTRAVENOUS | Status: DC | PRN
  Administered 2020-02-27: 80 ug via INTRAVENOUS
  Administered 2020-02-27: 120 ug via INTRAVENOUS
  Administered 2020-02-27 (×3): 80 ug via INTRAVENOUS
  Administered 2020-02-27: 40 ug via INTRAVENOUS
  Administered 2020-02-27 (×2): 80 ug via INTRAVENOUS

## 2020-02-27 MED ORDER — LACTATED RINGERS IV SOLN: INTRAVENOUS | Status: DC

## 2020-02-27 MED ORDER — OXYCODONE HCL 5 MG PO TABS: 5.0000 mg | ORAL_TABLET | Freq: Four times a day (QID) | ORAL | 0 refills | Status: DC | PRN

## 2020-02-27 MED ORDER — FENTANYL CITRATE (PF) 250 MCG/5ML IJ SOLN
INTRAMUSCULAR | Status: DC | PRN
  Administered 2020-02-27: 100 ug via INTRAVENOUS
  Administered 2020-02-27: 50 ug via INTRAVENOUS

## 2020-02-27 MED ORDER — BUPIVACAINE-EPINEPHRINE (PF) 0.25% -1:200000 IJ SOLN
INTRAMUSCULAR | Status: AC
  Filled 2020-02-27: qty 20

## 2020-02-27 MED ORDER — CHLORHEXIDINE GLUCONATE 0.12 % MT SOLN
15.0000 mL | Freq: Once | OROMUCOSAL | Status: AC
  Administered 2020-02-27: 15 mL via OROMUCOSAL
  Filled 2020-02-27: qty 15

## 2020-02-27 MED ORDER — FENTANYL CITRATE (PF) 100 MCG/2ML IJ SOLN: 25.0000 ug | INTRAMUSCULAR | Status: DC | PRN

## 2020-02-27 MED ORDER — MIDAZOLAM HCL 5 MG/5ML IJ SOLN
INTRAMUSCULAR | Status: DC | PRN
  Administered 2020-02-27: 2 mg via INTRAVENOUS

## 2020-02-27 MED ORDER — ACETAMINOPHEN 10 MG/ML IV SOLN: 1000.0000 mg | Freq: Once | INTRAVENOUS | Status: DC | PRN

## 2020-02-27 MED ORDER — ONDANSETRON HCL 4 MG/2ML IJ SOLN
INTRAMUSCULAR | Status: DC | PRN
  Administered 2020-02-27: 4 mg via INTRAVENOUS

## 2020-02-27 MED ORDER — PROPOFOL 10 MG/ML IV BOLUS
INTRAVENOUS | Status: DC | PRN
  Administered 2020-02-27: 200 mg via INTRAVENOUS

## 2020-02-27 MED ORDER — FENTANYL CITRATE (PF) 250 MCG/5ML IJ SOLN
INTRAMUSCULAR | Status: AC
  Filled 2020-02-27: qty 5

## 2020-02-27 MED ORDER — CHLORHEXIDINE GLUCONATE CLOTH 2 % EX PADS: 6.0000 | MEDICATED_PAD | Freq: Once | CUTANEOUS | Status: DC

## 2020-02-27 MED ORDER — ORAL CARE MOUTH RINSE: 15.0000 mL | Freq: Once | OROMUCOSAL | Status: AC

## 2020-02-27 MED ORDER — LIDOCAINE HCL 1 % IJ SOLN
INTRAMUSCULAR | Status: DC | PRN
  Administered 2020-02-27: 30 mL

## 2020-02-27 MED ORDER — OXYCODONE HCL 5 MG/5ML PO SOLN: 5.0000 mg | Freq: Once | ORAL | Status: DC | PRN

## 2020-02-27 MED ORDER — MIDAZOLAM HCL 2 MG/2ML IJ SOLN
INTRAMUSCULAR | Status: AC
  Filled 2020-02-27: qty 2

## 2020-02-27 MED ORDER — PROPOFOL 10 MG/ML IV BOLUS
INTRAVENOUS | Status: AC
  Filled 2020-02-27: qty 20

## 2020-02-27 MED ORDER — LIDOCAINE HCL (CARDIAC) PF 100 MG/5ML IV SOSY
PREFILLED_SYRINGE | INTRAVENOUS | Status: DC | PRN
  Administered 2020-02-27: 80 mg via INTRATRACHEAL

## 2020-02-27 MED ORDER — ACETAMINOPHEN 500 MG PO TABS: 1000.0000 mg | ORAL_TABLET | Freq: Once | ORAL | Status: DC | PRN

## 2020-02-27 MED ORDER — ACETAMINOPHEN 500 MG PO TABS
1000.0000 mg | ORAL_TABLET | ORAL | Status: AC
  Administered 2020-02-27: 1000 mg via ORAL
  Filled 2020-02-27: qty 2

## 2020-02-27 MED ORDER — ACETAMINOPHEN 160 MG/5ML PO SOLN: 1000.0000 mg | Freq: Once | ORAL | Status: DC | PRN

## 2020-02-27 MED ORDER — 0.9 % SODIUM CHLORIDE (POUR BTL) OPTIME
TOPICAL | Status: DC | PRN
  Administered 2020-02-27: 1000 mL

## 2020-02-27 MED ORDER — LIDOCAINE HCL 1 % IJ SOLN
INTRAMUSCULAR | Status: AC
  Filled 2020-02-27: qty 20

## 2020-02-27 MED ORDER — DEXAMETHASONE SODIUM PHOSPHATE 10 MG/ML IJ SOLN
INTRAMUSCULAR | Status: DC | PRN
  Administered 2020-02-27: 4 mg via INTRAVENOUS

## 2020-02-27 MED ORDER — SUCCINYLCHOLINE CHLORIDE 20 MG/ML IJ SOLN
INTRAMUSCULAR | Status: DC | PRN
  Administered 2020-02-27: 120 mg via INTRAVENOUS

## 2020-02-27 MED ORDER — OXYCODONE HCL 5 MG PO TABS: 5.0000 mg | ORAL_TABLET | Freq: Once | ORAL | Status: DC | PRN

## 2020-02-27 SURGICAL SUPPLY — 54 items
ADH SKN CLS APL DERMABOND .7 (GAUZE/BANDAGES/DRESSINGS) ×1
APL PRP STRL LF DISP 70% ISPRP (MISCELLANEOUS) ×1
APL SKNCLS STERI-STRIP NONHPOA (GAUZE/BANDAGES/DRESSINGS)
BENZOIN TINCTURE PRP APPL 2/3 (GAUZE/BANDAGES/DRESSINGS) IMPLANT
BINDER BREAST 3XL (GAUZE/BANDAGES/DRESSINGS) ×2 IMPLANT
BINDER BREAST LRG (GAUZE/BANDAGES/DRESSINGS) IMPLANT
BINDER BREAST XLRG (GAUZE/BANDAGES/DRESSINGS) IMPLANT
BLADE SURG 10 STRL SS (BLADE) ×3 IMPLANT
CANISTER SUCT 3000ML PPV (MISCELLANEOUS) ×3 IMPLANT
CHLORAPREP W/TINT 26 (MISCELLANEOUS) ×3 IMPLANT
CLOSURE STERI-STRIP 1/4X4 (GAUZE/BANDAGES/DRESSINGS) ×2 IMPLANT
CLOSURE WOUND 1/2 X4 (GAUZE/BANDAGES/DRESSINGS) ×1
CNTNR URN SCR LID CUP LEK RST (MISCELLANEOUS) IMPLANT
CONT SPEC 4OZ STRL OR WHT (MISCELLANEOUS) ×3
COVER SURGICAL LIGHT HANDLE (MISCELLANEOUS) ×3 IMPLANT
COVER WAND RF STERILE (DRAPES) ×3 IMPLANT
DECANTER SPIKE VIAL GLASS SM (MISCELLANEOUS) ×6 IMPLANT
DERMABOND ADVANCED (GAUZE/BANDAGES/DRESSINGS) ×2
DERMABOND ADVANCED .7 DNX12 (GAUZE/BANDAGES/DRESSINGS) ×1 IMPLANT
DRAPE CHEST BREAST 15X10 FENES (DRAPES) ×3 IMPLANT
ELECT REM PT RETURN 9FT ADLT (ELECTROSURGICAL) ×3
ELECTRODE REM PT RTRN 9FT ADLT (ELECTROSURGICAL) ×1 IMPLANT
GAUZE SPONGE 4X4 12PLY STRL (GAUZE/BANDAGES/DRESSINGS) IMPLANT
GAUZE SPONGE 4X4 12PLY STRL LF (GAUZE/BANDAGES/DRESSINGS) ×2 IMPLANT
GLOVE BIO SURGEON STRL SZ 6 (GLOVE) ×3 IMPLANT
GLOVE INDICATOR 6.5 STRL GRN (GLOVE) ×3 IMPLANT
GOWN STRL REUS W/ TWL LRG LVL3 (GOWN DISPOSABLE) ×1 IMPLANT
GOWN STRL REUS W/TWL 2XL LVL3 (GOWN DISPOSABLE) ×3 IMPLANT
GOWN STRL REUS W/TWL LRG LVL3 (GOWN DISPOSABLE) ×3
ILLUMINATOR WAVEGUIDE N/F (MISCELLANEOUS) IMPLANT
KIT BASIN OR (CUSTOM PROCEDURE TRAY) ×3 IMPLANT
KIT TURNOVER KIT B (KITS) ×3 IMPLANT
LIGHT WAVEGUIDE WIDE FLAT (MISCELLANEOUS) IMPLANT
NDL HYPO 25GX1X1/2 BEV (NEEDLE) ×1 IMPLANT
NEEDLE HYPO 25GX1X1/2 BEV (NEEDLE) ×3 IMPLANT
NS IRRIG 1000ML POUR BTL (IV SOLUTION) ×3 IMPLANT
PACK GENERAL/GYN (CUSTOM PROCEDURE TRAY) ×3 IMPLANT
PAD ARMBOARD 7.5X6 YLW CONV (MISCELLANEOUS) ×3 IMPLANT
PENCIL SMOKE EVACUATOR (MISCELLANEOUS) ×3 IMPLANT
SPONGE LAP 18X18 RF (DISPOSABLE) ×3 IMPLANT
STRIP CLOSURE SKIN 1/2X4 (GAUZE/BANDAGES/DRESSINGS) ×1 IMPLANT
SUT MNCRL AB 4-0 PS2 18 (SUTURE) ×2 IMPLANT
SUT MON AB 4-0 PC3 18 (SUTURE) ×3 IMPLANT
SUT SILK 2 0 PERMA HAND 18 BK (SUTURE) ×2 IMPLANT
SUT VIC AB 2-0 SH 27 (SUTURE) ×3
SUT VIC AB 2-0 SH 27XBRD (SUTURE) IMPLANT
SUT VIC AB 3-0 SH 27 (SUTURE) ×6
SUT VIC AB 3-0 SH 27X BRD (SUTURE) ×1 IMPLANT
SWAB COLLECTION DEVICE MRSA (MISCELLANEOUS) ×2 IMPLANT
SWAB CULTURE ESWAB REG 1ML (MISCELLANEOUS) ×2 IMPLANT
SYR BULB IRRIG 60ML STRL (SYRINGE) ×2 IMPLANT
SYR CONTROL 10ML LL (SYRINGE) ×3 IMPLANT
TOWEL GREEN STERILE (TOWEL DISPOSABLE) ×3 IMPLANT
TOWEL GREEN STERILE FF (TOWEL DISPOSABLE) ×3 IMPLANT

## 2020-02-27 NOTE — Interval H&P Note (Signed)
History and Physical Interval Note:  02/27/2020 8:58 AM  Courtney [name]  has presented today for surgery, with the diagnosis of LEFT BREAST MASS.  The various methods of treatment have been discussed with the patient and family. After consideration of risks, benefits and other options for treatment, the patient has consented to  Procedure(s): EXCISION OF LEFT BREAST MASS (Left) as a surgical intervention.  The patient's history has been reviewed, patient examined, no change in status, stable for surgery.  I have reviewed the patient's chart and labs.  Questions were answered to the patient's satisfaction.     Stark Klein

## 2020-02-27 NOTE — Discharge Instructions (Addendum)
[facility] Phone Number [phone]  BREAST BIOPSY/ PARTIAL MASTECTOMY: POST OP INSTRUCTIONS  Always review your discharge instruction sheet given to you by the facility where your surgery was performed.  IF YOU HAVE DISABILITY OR FAMILY LEAVE FORMS, YOU MUST BRING THEM TO THE OFFICE FOR PROCESSING.  DO NOT GIVE THEM TO YOUR DOCTOR.  1. A prescription for pain medication may be given to you upon discharge.  Take your pain medication as prescribed, if needed.  If narcotic pain medicine is not needed, then you may take acetaminophen (Tylenol) or ibuprofen (Advil) as needed. 2. Take your usually prescribed medications unless otherwise directed 3. If you need a refill on your pain medication, please contact your pharmacy.  They will contact our office to request authorization.  Prescriptions will not be filled after 5pm or on week-ends. 4. You should eat very light the first 24 hours after surgery, such as soup, crackers, pudding, etc.  Resume your normal diet the day after surgery. 5. Most patients will experience some swelling and bruising in the breast.  Ice packs and a good support bra will help.  Swelling and bruising can take several days to resolve.  6. It is common to experience some constipation if taking pain medication after surgery.  Increasing fluid intake and taking a stool softener will usually help or prevent this problem from occurring.  A mild laxative (Milk of Magnesia or Miralax) should be taken according to package directions if there are no bowel movements after 48 hours. 7. Unless discharge instructions indicate otherwise, you may remove your bandages 48 hours after surgery, and you may shower at that time.  You may have steri-strips (small skin tapes) in place directly over the incision.  These strips should be left on the skin for 7-10 days.   Any sutures or staples will be removed at the office during your follow-up visit. 8. ACTIVITIES:  You may resume  regular daily activities (gradually increasing) beginning the next day.  Wearing a good support bra or sports bra (or the breast binder) minimizes pain and swelling.  You may have sexual intercourse when it is comfortable. a. You may drive when you no longer are taking prescription pain medication, you can comfortably wear a seatbelt, and you can safely maneuver your car and apply brakes. b. RETURN TO WORK:  __________as tolerated if no lifting for 1-2 weeks_______________ 9. You should see your doctor in the office for a follow-up appointment approximately two weeks after your surgery.  Your doctor's nurse will typically make your follow-up appointment when she calls you with your pathology report.  Expect your pathology report 2-3 business days after your surgery.  You may call to check if you do not hear from [place] after three days.   WHEN TO CALL YOUR DOCTOR: 1. Fever over 101.0 2. Nausea and/or vomiting. 3. Extreme swelling or bruising. 4. Continued bleeding from incision. 5. Increased pain, redness, or drainage from the incision.  The clinic staff is available to answer your questions during regular business hours.  Please don't hesitate to call and ask to speak to one of the nurses for clinical concerns.  If you have a medical emergency, go to the nearest emergency room or call 911.  A surgeon from [facility] is always on call at the hospital.  For further questions, please visit [email or web address]

## 2020-02-27 NOTE — Op Note (Addendum)
Excisional Breast Biopsy and excision of abdominal wall mass (2x1x1.5 cm)  Indications: This patient presents with history of left breast mass, clinically recurrent sebaceous cyst.  She also has a new inframammary/upper abdominal lesion that also appears to be a new cyst.  Pre-operative Diagnosis: left breast mass, left upper abdominal wall mass.  Post-operative Diagnosis: Same   Surgeon: Stark Klein   Anesthesia: general anesthesia  ASA Class: 3  Procedure Details  The patient was seen in the Holding Room. The risks, benefits, complications, treatment options, and expected outcomes were discussed with the patient. The possibilities of reaction to medication, pulmonary aspiration, bleeding, infection, the need for additional procedures, failure to diagnose a condition, and creating a complication requiring transfusion or operation were discussed with the patient. The patient concurred with the proposed plan, giving informed consent.  The site of surgery properly noted/marked. The patient was taken to Operating Room # 1, identified, and the procedure verified as Breast Excisional Biopsy. A Time Out was held and the above information confirmed.  After induction of anesthesia, the left  breast and chest were prepped and draped in standard fashion. The excision was performed by making an elliptical incision using the prior inframammary breast reduction incision and incorporating skin over the mass.  Dissection was carried down around the mass to normal fibrofatty breast tissue.  Hemostasis was achieved with cautery.  The wound was irrigated and closed with a 3-0 Vicryl interrupted deep dermal stitch and a 4-0 Monocryl subcuticular closure in layers.  This was dressed with dermabond and the dermabond was allowed to dry.  The inframammary lesion was then addressed by making an elliptical incision over this as well incorporating skin.  There was a small amount of purulent material which was cultured.  The  mass was excised completely back to fatty tissue.  It was irrigated copiously.  Skin hooks were used to elevate the surrounding tissue.  This was then closed in three layers with deep 2-0 vicryl sutures, 3-0 vicryl interrupted deep dermal sutures, and running 4-0 monocryl sutures.  This was also dressed with dermabond.     Sterile dressings were applied with gauze and a breast binder. At the end of the operation, all sponge, instrument, and needle counts were correct.  Findings: likely indolent recurrent epidermal (sebaceous) cyst in the breast, actively inflamed inframmary/upper abdominal mass c/w same  Estimated Blood Loss:  Minimal         Specimens: left breast mass, left upper abdominal wall mass both to pathology, culture of upper abdominal mass to microbiology         Complications:  None; patient tolerated the procedure well.         Disposition: PACU - hemodynamically stable.         Condition: stable

## 2020-02-27 NOTE — Transfer of Care (Signed)
Immediate Anesthesia Transfer of Care Note  Patient: Courtney [name]  Procedure(s) Performed: EXCISION OF LEFT BREAST MASS (Left Breast)  Patient Location: PACU  Anesthesia Type:General  Level of Consciousness: awake, alert  and oriented  Airway & Oxygen Therapy: Patient Spontanous Breathing and Patient connected to nasal cannula oxygen  Post-op Assessment: Report given to RN and Post -op Vital signs reviewed and stable  Post vital signs: Reviewed and stable  Last Vitals:  Vitals Value Taken Time  BP 143/101 02/27/20 1036  Temp    Pulse 86 02/27/20 1036  Resp 9 02/27/20 1036  SpO2 100 % 02/27/20 1036  Vitals shown include unvalidated device data.  Last Pain:  Vitals:   02/27/20 0751  TempSrc:   PainSc: 0-No pain         Complications: No complications documented.

## 2020-02-27 NOTE — Anesthesia Preprocedure Evaluation (Addendum)
Anesthesia Evaluation  Patient identified by MRN, date of birth, ID band Patient awake    Reviewed: Allergy & Precautions, NPO status , Patient's Chart, lab work & pertinent test results  History of Anesthesia Complications Negative for: history of anesthetic complications  Airway Mallampati: II  TM Distance: >3 FB Neck ROM: Full    Dental  (+) Partial Upper,    Pulmonary neg pulmonary ROS, neg recent URI,  Covid-19 Nucleic Acid Test Results Lab Results      Component                Value               Date                      [place]              NEGATIVE            02/23/2020                [place]              Not Detected        07/10/2019                [place]              Not Detected        03/29/2019                [place]              Not Detected        01/10/2019                [place]              Not Detected        12/06/2018              breath sounds clear to auscultation       Cardiovascular hypertension, Pt. on medications (-) angina(-) Past MI and (-) CHF  Rhythm:Regular     Neuro/Psych negative neurological ROS  negative psych ROS   GI/Hepatic Neg liver ROS, GERD  ,  Endo/Other  Morbid obesity  Renal/GU negative Renal ROS     Musculoskeletal negative musculoskeletal ROS (+)   Abdominal   Peds  Hematology negative hematology ROS (+) Lab Results      Component                Value               Date                      WBC                      8.5                 02/20/2020                HGB                      12.8                02/20/2020                HCT  40.2                02/20/2020                MCV                      94.6                02/20/2020                PLT                      356                 02/20/2020              Anesthesia Other Findings   Reproductive/Obstetrics                             Anesthesia Physical Anesthesia Plan  ASA: III  Anesthesia Plan: General   Post-op Pain Management:    Induction: Intravenous  PONV Risk Score and Plan: 3 and Ondansetron and Dexamethasone  Airway Management Planned: Oral ETT  Additional Equipment: None  Intra-op Plan:   Post-operative Plan: Extubation in OR  Informed Consent: I have reviewed the patients History and Physical, chart, labs and discussed the procedure including the risks, benefits and alternatives for the proposed anesthesia with the patient or authorized representative who has indicated his/her understanding and acceptance.     Dental advisory given  Plan Discussed with: CRNA and Surgeon  Anesthesia Plan Comments:         Anesthesia Quick Evaluation

## 2020-02-27 NOTE — Anesthesia Procedure Notes (Signed)
Procedure Name: Intubation Date/Time: 02/27/2020 9:14 AM Performed by: Wilburn Cornelia, CRNA Pre-anesthesia Checklist: Patient identified, Emergency Drugs available, Suction available, Patient being monitored and Timeout performed Patient Re-evaluated:Patient Re-evaluated prior to induction Oxygen Delivery Method: Circle system utilized Preoxygenation: Pre-oxygenation with 100% oxygen Induction Type: IV induction Ventilation: Mask ventilation without difficulty Laryngoscope Size: Mac and 3 Grade View: Grade II Tube type: Oral Nasal Tubes: Right Tube size: 7.0 mm Number of attempts: 1 Airway Equipment and Method: Stylet Placement Confirmation: ETT inserted through vocal cords under direct vision,  positive ETCO2,  CO2 detector and breath sounds checked- equal and bilateral Secured at: 21 cm Tube secured with: Tape Dental Injury: Teeth and Oropharynx as per pre-operative assessment

## 2020-02-28 ENCOUNTER — Encounter (HOSPITAL_COMMUNITY): Payer: Self-pay | Admitting: General Surgery

## 2020-02-28 LAB — SURGICAL PATHOLOGY

## 2020-03-03 LAB — AEROBIC/ANAEROBIC CULTURE W GRAM STAIN (SURGICAL/DEEP WOUND)
Culture: NO GROWTH
Gram Stain: NONE SEEN

## 2020-03-03 NOTE — Anesthesia Postprocedure Evaluation (Signed)
Anesthesia Post Note  Patient: Blondell Reveal  Procedure(s) Performed: EXCISION OF LEFT BREAST MASS (Left Breast)     Patient location during evaluation: PACU Anesthesia Type: General Level of consciousness: awake and alert Pain management: pain level controlled Vital Signs Assessment: post-procedure vital signs reviewed and stable Respiratory status: spontaneous breathing, nonlabored ventilation, respiratory function stable and patient connected to nasal cannula oxygen Cardiovascular status: blood pressure returned to baseline and stable Postop Assessment: no apparent nausea or vomiting Anesthetic complications: no   No complications documented.  Last Vitals:  Vitals:   02/27/20 1040 02/27/20 1105  BP: (!) 167/99 (!) 155/99  Pulse: 81 73  Resp: 15 17  Temp:  (!) 36.1 C  SpO2: 100% 100%    Last Pain:  Vitals:   02/27/20 1105  TempSrc:   PainSc: 0-No pain                 Rikki Smestad

## 2020-03-04 DIAGNOSIS — Z23 Encounter for immunization: Secondary | ICD-10-CM | POA: Diagnosis not present

## 2020-03-04 DIAGNOSIS — D509 Iron deficiency anemia, unspecified: Secondary | ICD-10-CM | POA: Diagnosis not present

## 2020-03-04 DIAGNOSIS — E785 Hyperlipidemia, unspecified: Secondary | ICD-10-CM | POA: Diagnosis not present

## 2020-03-04 DIAGNOSIS — Z0000 Encounter for general adult medical examination without abnormal findings: Secondary | ICD-10-CM | POA: Diagnosis not present

## 2020-03-04 DIAGNOSIS — I10 Essential (primary) hypertension: Secondary | ICD-10-CM | POA: Diagnosis not present

## 2020-09-01 DIAGNOSIS — E785 Hyperlipidemia, unspecified: Secondary | ICD-10-CM | POA: Diagnosis not present

## 2020-09-01 DIAGNOSIS — I10 Essential (primary) hypertension: Secondary | ICD-10-CM | POA: Diagnosis not present

## 2020-09-15 ENCOUNTER — Other Ambulatory Visit: Payer: Self-pay | Admitting: General Surgery

## 2020-09-15 DIAGNOSIS — N63 Unspecified lump in unspecified breast: Secondary | ICD-10-CM

## 2020-09-17 ENCOUNTER — Ambulatory Visit
Admission: RE | Admit: 2020-09-17 | Discharge: 2020-09-17 | Disposition: A | Discharge status: Home / Self Care | Payer: BC Managed Care – PPO | Source: Ambulatory Visit | Attending: General Surgery | Admitting: General Surgery

## 2020-09-17 ENCOUNTER — Other Ambulatory Visit: Payer: Self-pay

## 2020-09-17 DIAGNOSIS — R928 Other abnormal and inconclusive findings on diagnostic imaging of breast: Secondary | ICD-10-CM | POA: Diagnosis not present

## 2020-09-17 DIAGNOSIS — N63 Unspecified lump in unspecified breast: Secondary | ICD-10-CM

## 2020-09-17 DIAGNOSIS — N6489 Other specified disorders of breast: Secondary | ICD-10-CM | POA: Diagnosis not present

## 2020-09-17 DIAGNOSIS — R922 Inconclusive mammogram: Secondary | ICD-10-CM | POA: Diagnosis not present

## 2020-09-19 DIAGNOSIS — N63 Unspecified lump in unspecified breast: Secondary | ICD-10-CM | POA: Diagnosis not present

## 2020-09-19 DIAGNOSIS — N644 Mastodynia: Secondary | ICD-10-CM | POA: Diagnosis not present

## 2020-10-06 DIAGNOSIS — N611 Abscess of the breast and nipple: Secondary | ICD-10-CM | POA: Diagnosis not present

## 2020-10-28 DIAGNOSIS — N611 Abscess of the breast and nipple: Secondary | ICD-10-CM | POA: Diagnosis not present

## 2020-10-30 ENCOUNTER — Other Ambulatory Visit: Payer: Self-pay | Admitting: Student

## 2020-10-30 DIAGNOSIS — N611 Abscess of the breast and nipple: Secondary | ICD-10-CM

## 2020-10-31 ENCOUNTER — Other Ambulatory Visit: Payer: Self-pay

## 2020-10-31 ENCOUNTER — Ambulatory Visit
Admission: RE | Admit: 2020-10-31 | Discharge: 2020-10-31 | Disposition: A | Discharge status: Home / Self Care | Payer: BC Managed Care – PPO | Source: Ambulatory Visit | Attending: Student | Admitting: Student

## 2020-10-31 ENCOUNTER — Ambulatory Visit: Payer: BC Managed Care – PPO

## 2020-10-31 DIAGNOSIS — N611 Abscess of the breast and nipple: Secondary | ICD-10-CM

## 2020-10-31 DIAGNOSIS — N6324 Unspecified lump in the left breast, lower inner quadrant: Secondary | ICD-10-CM | POA: Diagnosis not present

## 2020-10-31 DIAGNOSIS — R928 Other abnormal and inconclusive findings on diagnostic imaging of breast: Secondary | ICD-10-CM | POA: Diagnosis not present

## 2020-11-04 DIAGNOSIS — Z6841 Body Mass Index (BMI) 40.0 and over, adult: Secondary | ICD-10-CM | POA: Diagnosis not present

## 2020-11-04 DIAGNOSIS — Z01419 Encounter for gynecological examination (general) (routine) without abnormal findings: Secondary | ICD-10-CM | POA: Diagnosis not present

## 2020-11-20 DIAGNOSIS — Z1211 Encounter for screening for malignant neoplasm of colon: Secondary | ICD-10-CM | POA: Diagnosis not present

## 2020-11-20 DIAGNOSIS — K625 Hemorrhage of anus and rectum: Secondary | ICD-10-CM | POA: Diagnosis not present

## 2020-11-24 DIAGNOSIS — N6012 Diffuse cystic mastopathy of left breast: Secondary | ICD-10-CM | POA: Diagnosis not present

## 2020-12-05 DIAGNOSIS — Z20822 Contact with and (suspected) exposure to covid-19: Secondary | ICD-10-CM | POA: Diagnosis not present

## 2020-12-25 DIAGNOSIS — Z853 Personal history of malignant neoplasm of breast: Secondary | ICD-10-CM | POA: Diagnosis not present

## 2020-12-25 DIAGNOSIS — N611 Abscess of the breast and nipple: Secondary | ICD-10-CM | POA: Diagnosis not present

## 2021-01-20 DIAGNOSIS — Z4420 Encounter for fitting and adjustment of artificial eye, unspecified: Secondary | ICD-10-CM | POA: Diagnosis not present

## 2021-03-16 ENCOUNTER — Ambulatory Visit: Payer: BC Managed Care – PPO | Attending: Internal Medicine

## 2021-03-16 DIAGNOSIS — Z23 Encounter for immunization: Secondary | ICD-10-CM

## 2021-03-16 NOTE — Progress Notes (Signed)
   Covid-19 Vaccination Clinic  Name:  Courtney [name]    MRN: [id number] DOB: 05/06/1971  03/16/2021  [name] was observed post Covid-19 immunization for 15 minutes without incident. She was provided with Vaccine Information Sheet and instruction to access the V-Safe system.   [name] was instructed to call 911 with any severe reactions post vaccine: Difficulty breathing  Swelling of face and throat  A fast heartbeat  A bad rash all over body  Dizziness and weakness

## 2021-03-24 ENCOUNTER — Other Ambulatory Visit (HOSPITAL_BASED_OUTPATIENT_CLINIC_OR_DEPARTMENT_OTHER): Payer: Self-pay

## 2021-03-24 MED ORDER — COVID-19MRNA BIVAL VACC PFIZER 30 MCG/0.3ML IM SUSP
INTRAMUSCULAR | 0 refills | Status: AC
Start: 2021-03-16 — End: ?
  Filled 2021-03-24: qty 0.3, 1d supply, fill #0

## 2021-04-21 DIAGNOSIS — E785 Hyperlipidemia, unspecified: Secondary | ICD-10-CM | POA: Diagnosis not present

## 2021-04-21 DIAGNOSIS — I10 Essential (primary) hypertension: Secondary | ICD-10-CM | POA: Diagnosis not present

## 2021-04-21 DIAGNOSIS — D509 Iron deficiency anemia, unspecified: Secondary | ICD-10-CM | POA: Diagnosis not present

## 2021-04-21 DIAGNOSIS — Z0000 Encounter for general adult medical examination without abnormal findings: Secondary | ICD-10-CM | POA: Diagnosis not present

## 2021-05-12 DIAGNOSIS — N611 Abscess of the breast and nipple: Secondary | ICD-10-CM | POA: Diagnosis not present

## 2021-05-12 DIAGNOSIS — Z853 Personal history of malignant neoplasm of breast: Secondary | ICD-10-CM | POA: Diagnosis not present

## 2021-06-27 IMAGING — MG MM DIGITAL DIAGNOSTIC UNILAT*L* W/ TOMO W/ CAD
4 series · 5 of 12 positions shown · non-contrast
Comparison: Previous exam(s).

CLINICAL DATA: Patient presents for worsening of known
inflamed/infected cutaneous collection over the inner lower left
breast. This has been a chronic recurrent process for the patient.
Patient had surgery for this same problem February 2020 and did
well following surgery until September 2019 at which time this cutaneous
collection recurred. Patient is currently on second round of
antibiotics (doxycycline) since being seen 09/18/2019. Patient
states minimal improvement on antibiotics. Patient states this area
broke through the skin and drained a significant amount of
sanguinous material this morning. Previous right mastectomy and left
breast reduction mammoplasty.

EXAM:
DIGITAL DIAGNOSTIC UNILATERAL LEFT MAMMOGRAM WITH TOMOSYNTHESIS AND
CAD; ULTRASOUND LEFT BREAST LIMITED
TECHNIQUE: Left digital diagnostic mammography and breast tomosynthesis was
performed. The images were evaluated with computer-aided detection.;
Targeted ultrasound examination of the left breast was performed

[L CC synth-2D]
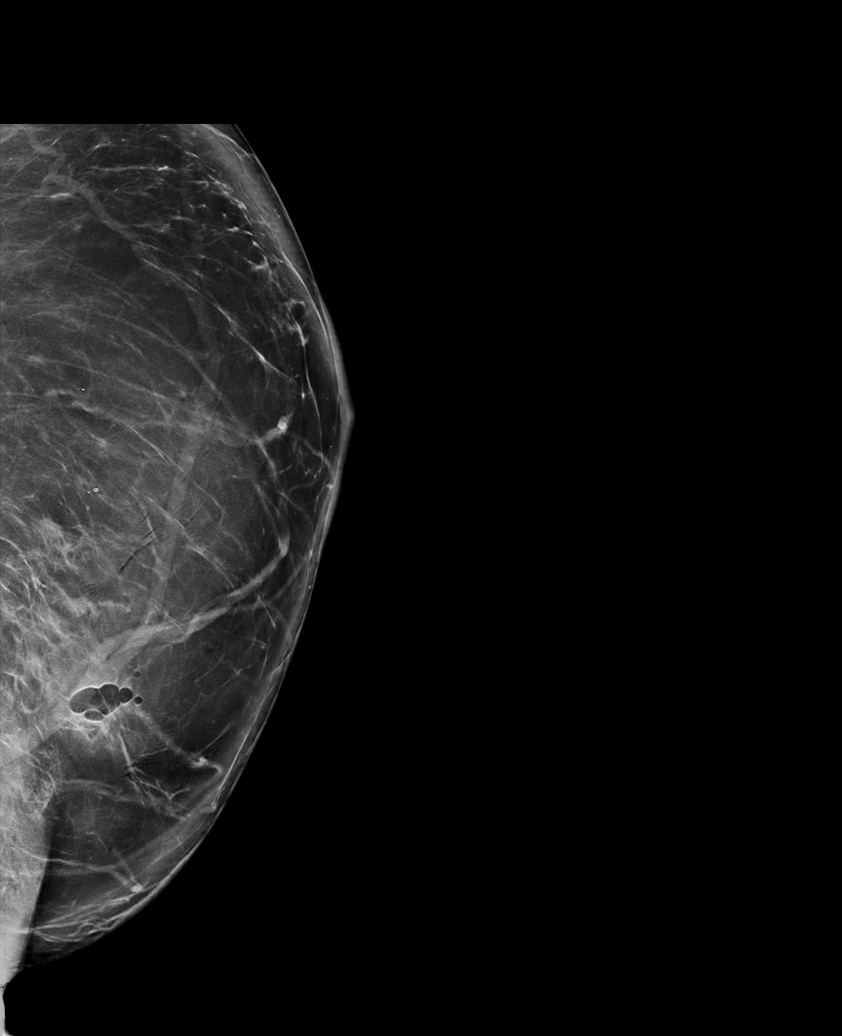

[L MLO synth-2D]
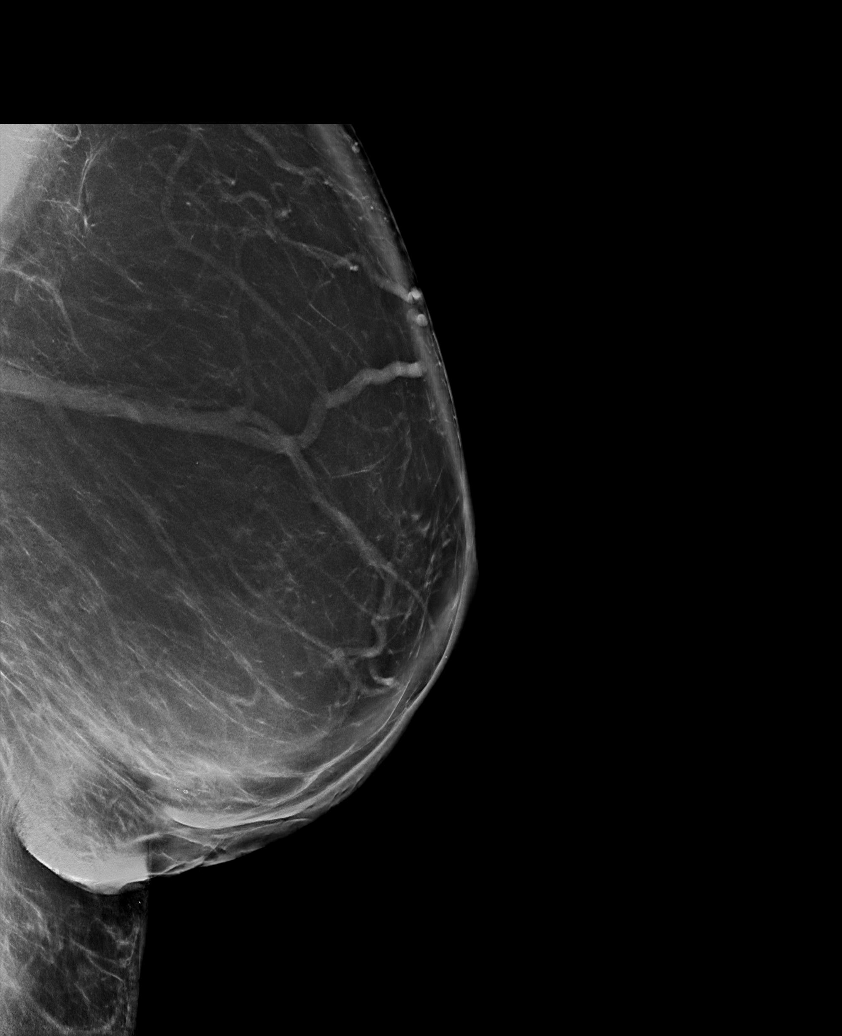

[L CC tomo · 2 of 106 frames shown]
[frame 35/106]
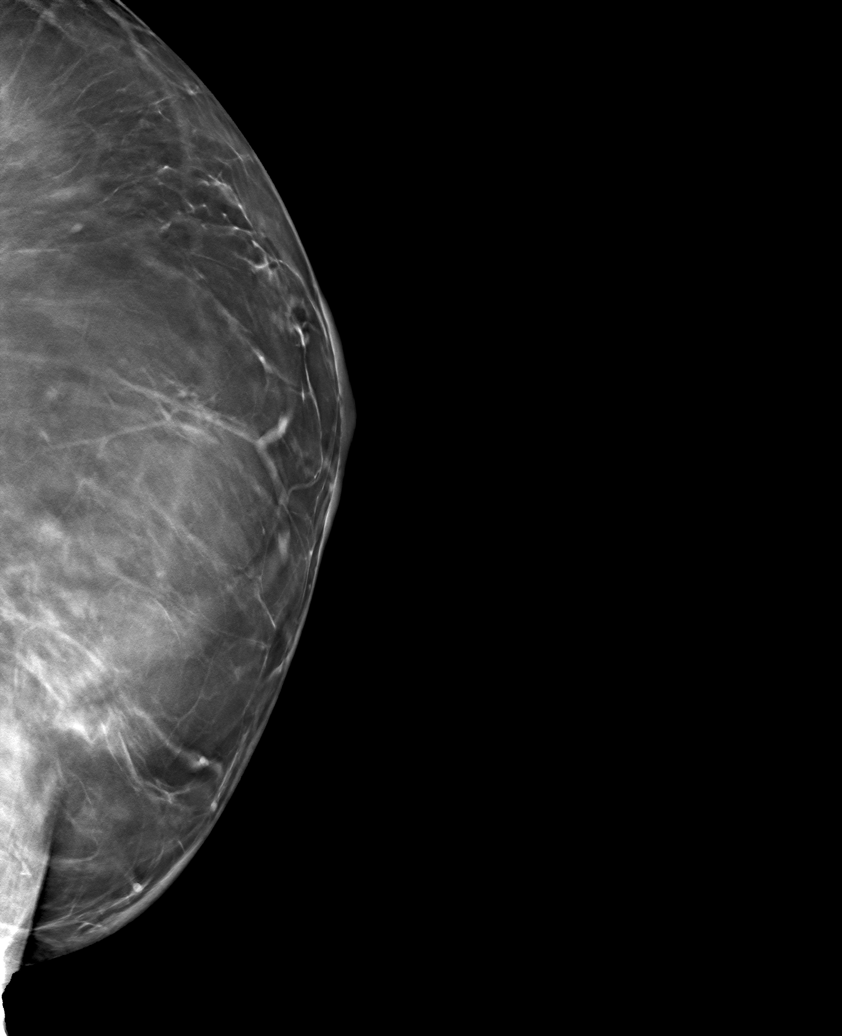
[frame 53/106]
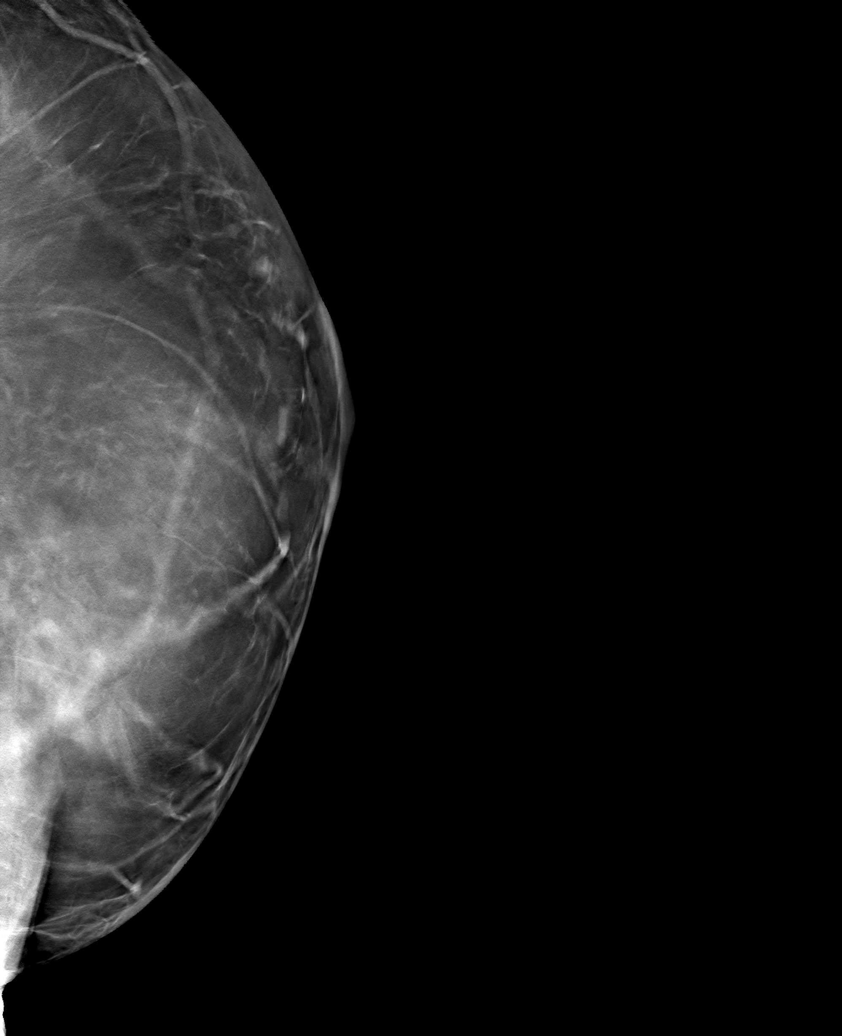

[L MLO tomo · tomo slice 62/123.0]
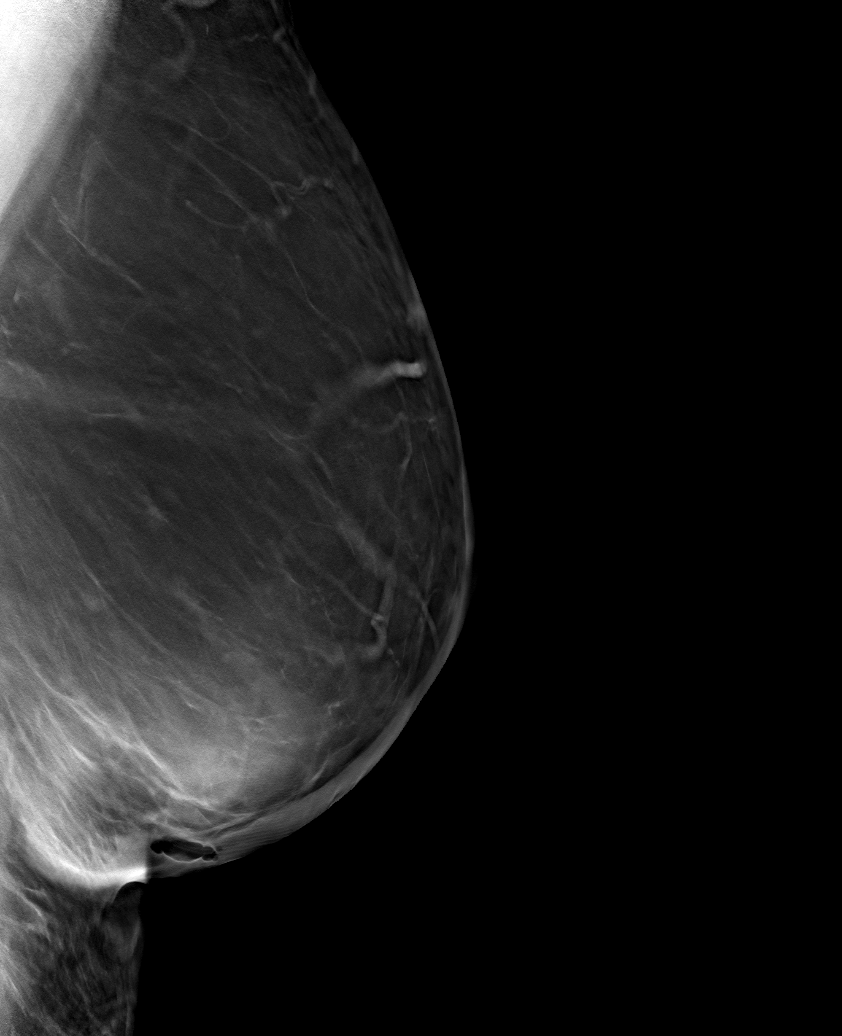

[5 of 12 positions shown; findings below may reference images not displayed]

ACR Breast Density Category b: There are scattered areas of
fibroglandular density.
FINDINGS: Examination demonstrates evidence of patient's known cutaneous
process over the inner lower left breast towards the inframammary
fold with small amount of air present over the collection compatible
recent episode of drainage. Remainder of the left breast is
unchanged.

On physical exam, there is a 4 x 8 cm indurated slightly red area
over the inner lower left breast towards the inframammary fold.
There is a single central focal area of disrupted skin. I was able
to elicit only a small amount of serosanguineous drainage from this
area upon compression.

Targeted ultrasound is performed, showing a moderate size
complicated fluid collection within the skin over the 8 o'clock
position of the left breast 10 cm from the nipple extending to the
inframammary fold measuring approximately 1 x 3.2 x 4.2 cm
(previously 0.7 x 2.3 x 2.4 cm). Air is seen over the superior
portion of this collection compatible with recent cutaneous
disruption and drainage. There is no extension of this process into
the deeper breast tissue. There is associated adjacent skin
thickening.
IMPRESSION: Slight interval worsening of recurrent complicated cutaneous fluid
collection over the 8 o'clock position of the left breast extending
to the inframammary fold with recent cutaneous disruption and
drainage of sanguinous material. This likely represents a recurrent
inflamed/infected sebaceous cyst.

RECOMMENDATION:
Recommend completion of patient's antibiotics and continued
management on a clinical basis as definitive surgical treatment may
be required. Patient states she has a follow-up surgical appointment
with [REDACTED].

I have discussed the findings and recommendations with the patient.
If applicable, a reminder letter will be sent to the patient
regarding the next appointment.

BI-RADS CATEGORY  2: Benign.

## 2021-06-27 IMAGING — US US BREAST*L* LIMITED INC AXILLA
1 series · 7 of 7 positions shown · non-contrast
Comparison: Previous exam(s).

CLINICAL DATA: Patient presents for worsening of known
inflamed/infected cutaneous collection over the inner lower left
breast. This has been a chronic recurrent process for the patient.
Patient had surgery for this same problem February 2020 and did
well following surgery until September 2019 at which time this cutaneous
collection recurred. Patient is currently on second round of
antibiotics (doxycycline) since being seen 09/18/2019. Patient
states minimal improvement on antibiotics. Patient states this area
broke through the skin and drained a significant amount of
sanguinous material this morning. Previous right mastectomy and left
breast reduction mammoplasty.

EXAM:
DIGITAL DIAGNOSTIC UNILATERAL LEFT MAMMOGRAM WITH TOMOSYNTHESIS AND
CAD; ULTRASOUND LEFT BREAST LIMITED
TECHNIQUE: Left digital diagnostic mammography and breast tomosynthesis was
performed. The images were evaluated with computer-aided detection.;
Targeted ultrasound examination of the left breast was performed

[Series 1: us breast*left* limited inc axilla · 0.07mm/px · 7 of 7 slices shown]
[im 1/7]
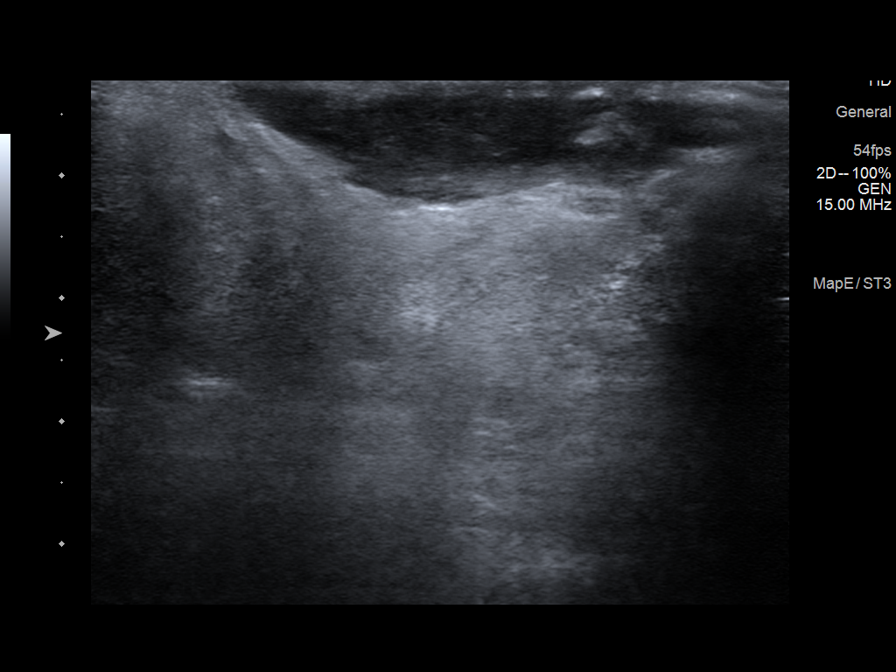
[im 2/7]
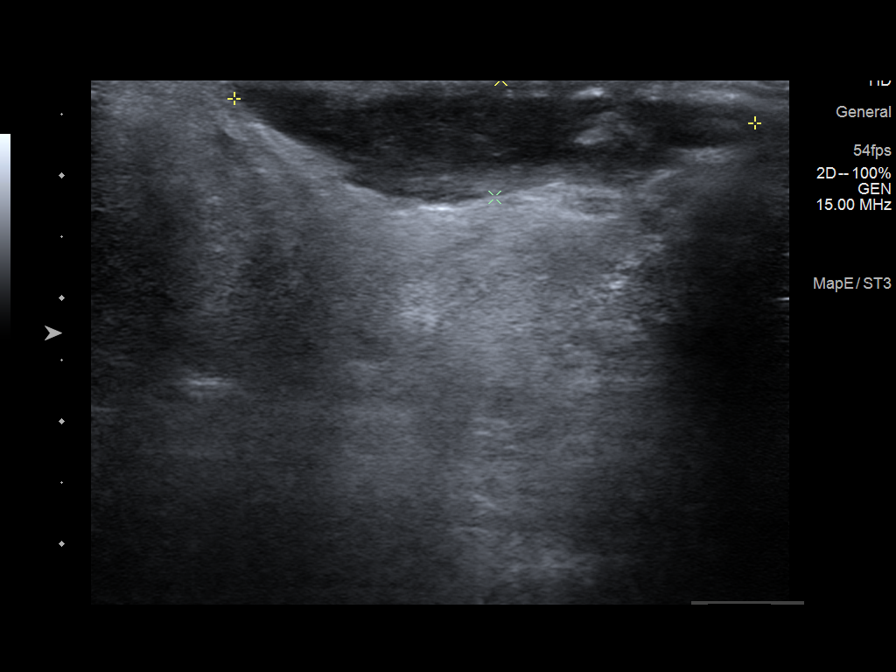
[im 3/7]
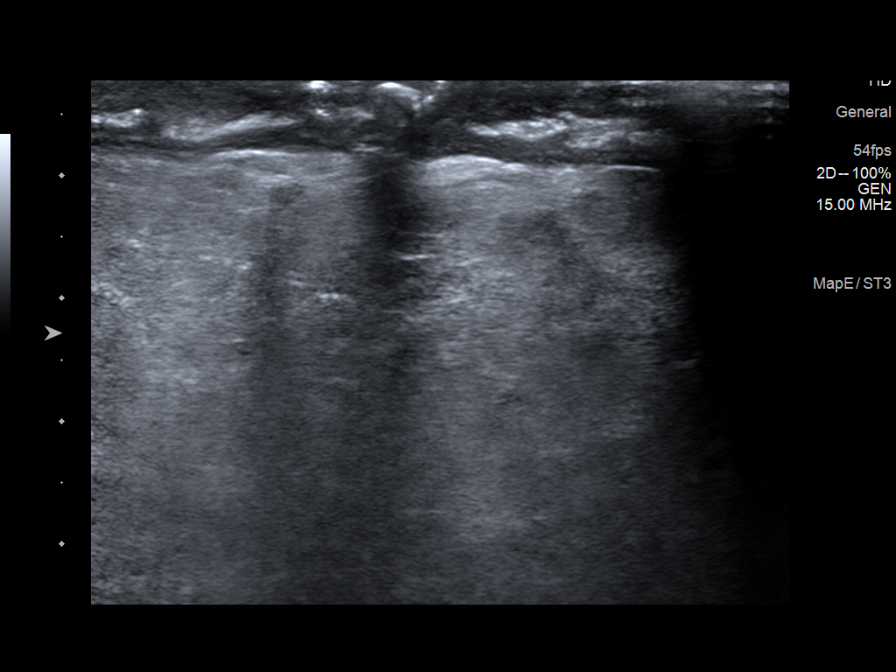
[im 4/7]
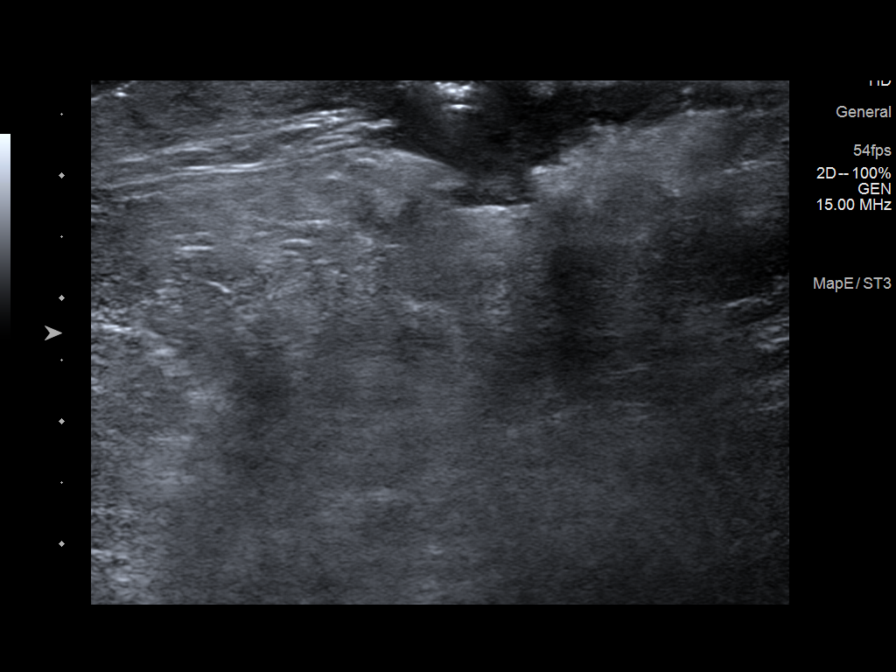
[im 5/7]
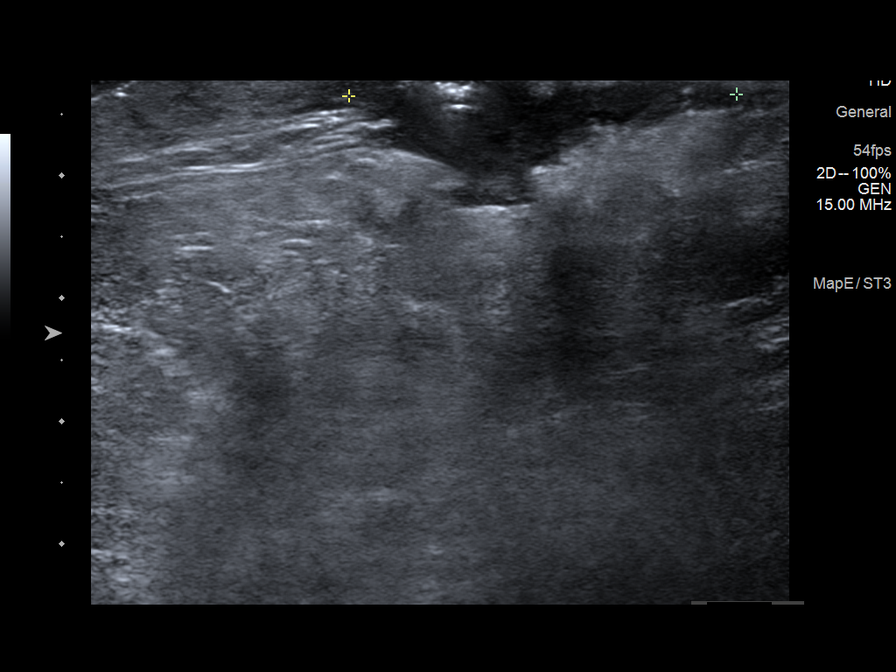
[im 6/7]
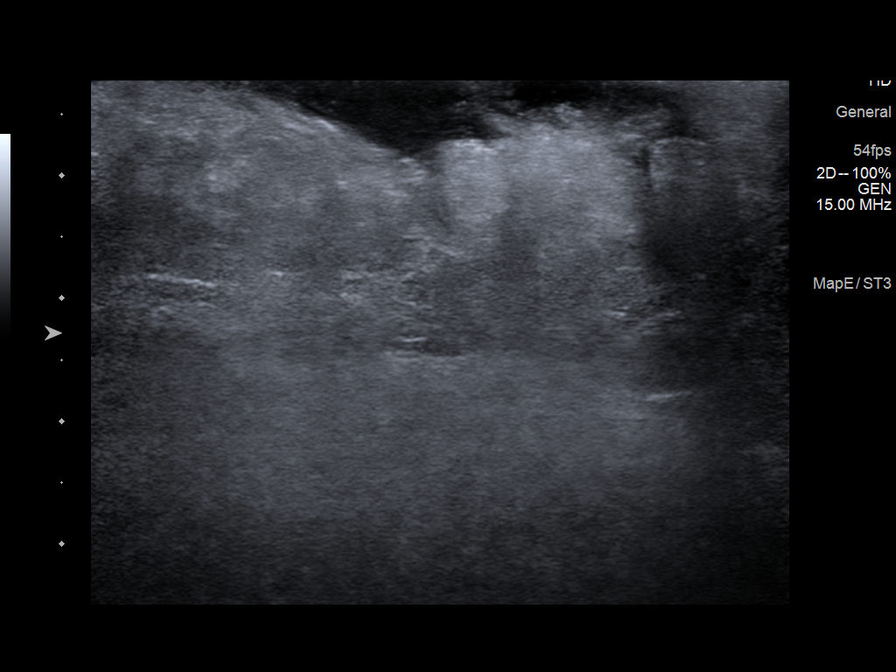
[im 7/7]
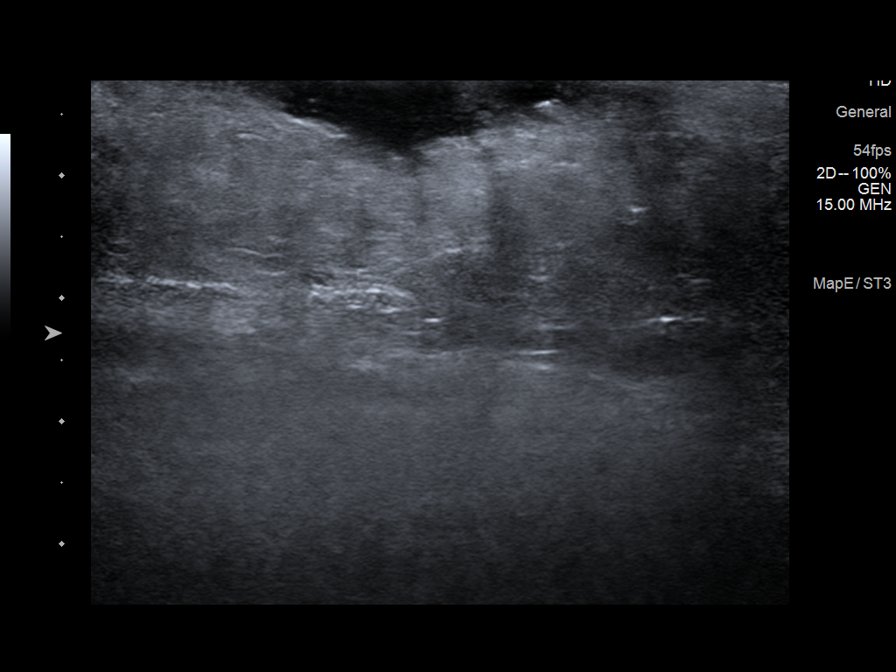

[7 of 7 positions shown; findings below may reference images not displayed]

ACR Breast Density Category b: There are scattered areas of
fibroglandular density.
FINDINGS: Examination demonstrates evidence of patient's known cutaneous
process over the inner lower left breast towards the inframammary
fold with small amount of air present over the collection compatible
recent episode of drainage. Remainder of the left breast is
unchanged.

On physical exam, there is a 4 x 8 cm indurated slightly red area
over the inner lower left breast towards the inframammary fold.
There is a single central focal area of disrupted skin. I was able
to elicit only a small amount of serosanguineous drainage from this
area upon compression.

Targeted ultrasound is performed, showing a moderate size
complicated fluid collection within the skin over the 8 o'clock
position of the left breast 10 cm from the nipple extending to the
inframammary fold measuring approximately 1 x 3.2 x 4.2 cm
(previously 0.7 x 2.3 x 2.4 cm). Air is seen over the superior
portion of this collection compatible with recent cutaneous
disruption and drainage. There is no extension of this process into
the deeper breast tissue. There is associated adjacent skin
thickening.
IMPRESSION: Slight interval worsening of recurrent complicated cutaneous fluid
collection over the 8 o'clock position of the left breast extending
to the inframammary fold with recent cutaneous disruption and
drainage of sanguinous material. This likely represents a recurrent
inflamed/infected sebaceous cyst.

RECOMMENDATION:
Recommend completion of patient's antibiotics and continued
management on a clinical basis as definitive surgical treatment may
be required. Patient states she has a follow-up surgical appointment
with [REDACTED].

I have discussed the findings and recommendations with the patient.
If applicable, a reminder letter will be sent to the patient
regarding the next appointment.

BI-RADS CATEGORY  2: Benign.

## 2021-07-17 DIAGNOSIS — Z4420 Encounter for fitting and adjustment of artificial eye, unspecified: Secondary | ICD-10-CM | POA: Diagnosis not present

## 2021-07-22 ENCOUNTER — Other Ambulatory Visit: Payer: Self-pay | Admitting: Gastroenterology

## 2021-08-06 DIAGNOSIS — Z713 Dietary counseling and surveillance: Secondary | ICD-10-CM | POA: Diagnosis not present

## 2021-08-24 DIAGNOSIS — H40011 Open angle with borderline findings, low risk, right eye: Secondary | ICD-10-CM | POA: Diagnosis not present

## 2021-09-25 ENCOUNTER — Encounter (HOSPITAL_COMMUNITY): Payer: Self-pay | Admitting: Gastroenterology

## 2021-10-02 ENCOUNTER — Ambulatory Visit (HOSPITAL_COMMUNITY): Payer: BC Managed Care – PPO | Admitting: Anesthesiology

## 2021-10-02 ENCOUNTER — Encounter (HOSPITAL_COMMUNITY): Payer: Self-pay | Admitting: Gastroenterology

## 2021-10-02 ENCOUNTER — Encounter (HOSPITAL_COMMUNITY)
Admission: RE | Disposition: A | Discharge status: Home / Self Care | Payer: Self-pay | Source: Ambulatory Visit | Attending: Gastroenterology

## 2021-10-02 ENCOUNTER — Other Ambulatory Visit: Payer: Self-pay

## 2021-10-02 ENCOUNTER — Ambulatory Visit (HOSPITAL_COMMUNITY)
Admission: RE | Admit: 2021-10-02 | Discharge: 2021-10-02 | Disposition: A | Discharge status: Home / Self Care | Payer: BC Managed Care – PPO | Source: Ambulatory Visit | Attending: Gastroenterology | Admitting: Gastroenterology

## 2021-10-02 DIAGNOSIS — K6389 Other specified diseases of intestine: Secondary | ICD-10-CM | POA: Diagnosis not present

## 2021-10-02 DIAGNOSIS — Z1211 Encounter for screening for malignant neoplasm of colon: Secondary | ICD-10-CM | POA: Insufficient documentation

## 2021-10-02 DIAGNOSIS — I10 Essential (primary) hypertension: Secondary | ICD-10-CM | POA: Diagnosis not present

## 2021-10-02 DIAGNOSIS — K635 Polyp of colon: Secondary | ICD-10-CM | POA: Diagnosis not present

## 2021-10-02 DIAGNOSIS — Z139 Encounter for screening, unspecified: Secondary | ICD-10-CM | POA: Diagnosis not present

## 2021-10-02 HISTORY — PX: COLONOSCOPY WITH PROPOFOL: SHX5780

## 2021-10-02 HISTORY — PX: POLYPECTOMY: SHX5525

## 2021-10-02 SURGERY — COLONOSCOPY WITH PROPOFOL
Anesthesia: Monitor Anesthesia Care

## 2021-10-02 MED ORDER — LACTATED RINGERS IV SOLN: INTRAVENOUS | Status: DC | PRN

## 2021-10-02 MED ORDER — LACTATED RINGERS IV SOLN
INTRAVENOUS | Status: AC | PRN
  Administered 2021-10-02: 1000 mL via INTRAVENOUS

## 2021-10-02 MED ORDER — PROPOFOL 500 MG/50ML IV EMUL
INTRAVENOUS | Status: DC | PRN
  Administered 2021-10-02: 150 ug/kg/min via INTRAVENOUS

## 2021-10-02 MED ORDER — PROPOFOL 10 MG/ML IV BOLUS
INTRAVENOUS | Status: DC | PRN
  Administered 2021-10-02: 50 mg via INTRAVENOUS

## 2021-10-02 SURGICAL SUPPLY — 22 items

## 2021-10-02 NOTE — Discharge Instructions (Signed)

## 2021-10-02 NOTE — H&P (Signed)
Courtney [name] ?HPI: This 51 year old black female presents to the office for colorectal cancer screening. She has 1-2 BM's per day. She has seen bright red blood on the toilet tissue occasionally but denies straining to facilitate BM's. She has good appetite and has gained 15 pounds over the last 10 years. She denies having any complaints of abdominal pain, nausea, vomiting, acid reflux, dysphagia or odynophagia. She denies having a family history of colon cancer, celiac sprue or IBD. Her last colonoscopy was done on 04/16/2011 when biopsies of the cecum revealed unremarkable colonic mucosa.  ? ?Past Medical History:  ?Diagnosis Date  ? Abscess of chest wall   ? left  ? Acid reflux   ? Anemia   ? Cancer [facility])   ? right breast  ? Folliculitis 88/28/0034  ? Hypertension   ? Pain, dental   ? ? ?Past Surgical History:  ?Procedure Laterality Date  ? BREAST CYST EXCISION Left 02/27/2020  ? Procedure: EXCISION OF LEFT BREAST MASS;  Surgeon: Stark Klein, MD;  Location: [facility];  Service: General;  Laterality: Left;  ? BREAST REDUCTION SURGERY    ? BREAST SURGERY  2004  ? mastectomy - right  ? coil     ? to prevent preg when she had breast ca surgery  ? EYE SURGERY    ? per patient "left eye prosthetic eye"  ? INCISE AND DRAIN ABCESS    ? left breast  ? MASTECTOMY    ? right  ? ORIF TIBIA PLATEAU Left 05/22/2013  ? Procedure: OPEN REDUCTION INTERNAL FIXATION (ORIF) LEFT TIBIAL PLATEAU;  Surgeon: Rozanna Box, MD;  Location: [facility];  Service: Orthopedics;  Laterality: Left;  ? ? ?Family History  ?Problem Relation Age of Onset  ? Heart disease Father   ? Prostate cancer Father   ?     dx in his 34s  ? Cancer Brother 16  ?     sarcoma detected in his leg  ? Breast cancer Paternal Aunt   ?     dx at 87-55  ? Lymphoma Maternal Aunt   ? Cancer Other 78  ?     unknown form of cancer; son of brother with sarcoma  ? Breast cancer Cousin   ?     4 maternal cousins with breast cancer dx in their 87s; 3 cousin are sisters to each other   ? ? ?Social History:  reports that she has never smoked. She has never used smokeless tobacco. She reports that she does not drink alcohol and does not use drugs. ? ?Allergies: No Known Allergies ? ?Medications: Scheduled: ?Continuous: ? ?No results found for this or any previous visit (from the past 24 hour(s)).  ? ?No results found. ? ?ROS:  As stated above in the HPI otherwise negative. ? ?Blood pressure (!) 161/88, pulse 75, temperature 97.8 ?F (36.6 ?C), temperature source Temporal, resp. rate 13, height 5' 5.5" (1.664 m), weight (!) 139.3 kg, SpO2 100 %.   ? ?PE: ?Gen: NAD, Alert and Oriented ?HEENT:  [place]/AT, EOMI ?Neck: Supple, no LAD ?Lungs: CTA Bilaterally ?CV: RRR without M/G/R ?ABD: Soft, NTND, +BS ?Ext: No C/C/E ? ?Assessment/Plan: ?1) Screening colonoscopy. ? ?Juel Bellerose D ?10/02/2021, 8:13 AM  ? ? ?  ? ?

## 2021-10-02 NOTE — Op Note (Signed)
[facility] ?Patient Name: Courtney [name] ?Procedure Date: 10/02/2021 ?MRN: [id number] ?Attending MD: Carol Ada , MD ?Date of Birth: [birth year] ?CSN: [id number] ?Age: 51 ?Admit Type: Outpatient ?Procedure:                Colonoscopy ?Indications:              Screening for colorectal malignant neoplasm ?Providers:                Carol Ada, MD, Ervin Knack, [place],  ?                          Technician ?Referring MD:              ?Medicines:                 ?Complications:            No immediate complications. ?Estimated Blood Loss:     Estimated blood loss: none. ?Procedure:                Pre-Anesthesia Assessment: ?                          - Prior to the procedure, a History and Physical  ?                          was performed, and patient medications and  ?                          allergies were reviewed. The patient's tolerance of  ?                          previous anesthesia was also reviewed. The risks  ?                          and benefits of the procedure and the sedation  ?                          options and risks were discussed with the patient.  ?                          All questions were answered, and informed consent  ?                          was obtained. Prior Anticoagulants: The patient has  ?                          taken no previous anticoagulant or antiplatelet  ?                          agents. ASA Grade Assessment: III - A patient with  ?                          severe systemic disease. After reviewing the risks  ?                          and benefits, the  patient was deemed in  ?                          satisfactory condition to undergo the procedure. ?                          - Sedation was administered by an anesthesia  ?                          professional. Deep sedation was attained. ?                          After obtaining informed consent, the colonoscope  ?                          was passed under direct vision. Throughout the  ?                           procedure, the patient's blood pressure, pulse, and  ?                          oxygen saturations were monitored continuously. The  ?                          CF-HQ190L ([id number]) Olympus colonoscope was  ?                          introduced through the anus and advanced to the the  ?                          cecum, identified by appendiceal orifice and  ?                          ileocecal valve. The colonoscopy was performed  ?                          without difficulty. The patient tolerated the  ?                          procedure well. The quality of the bowel  ?                          preparation was evaluated using the BBPS [facility]  ?                          Bowel Preparation Scale) with scores of: Right  ?                          Colon = 3, Transverse Colon = 3 and Left Colon = 3  ?                          (entire mucosa seen well with no residual staining,  ?  small fragments of stool or opaque liquid). The  ?                          total BBPS score equals 9. The ileocecal valve,  ?                          appendiceal orifice, and rectum were photographed. ?Scope In: 8:27:56 AM ?Scope Out: 8:49:57 AM ?Scope Withdrawal Time: 0 hours 16 minutes 28 seconds  ?Total Procedure Duration: 0 hours 22 minutes 1 second  ?Findings: ?     Three sessile polyps were found in the transverse colon and ascending  ?     colon. The polyps were 2 to 3 mm in size. These polyps were removed with  ?     a cold snare. Resection and retrieval were complete. To prevent bleeding  ?     post-intervention, two hemostatic clips were successfully placed. There  ?     was no bleeding at the end of the procedure. ?     Subjectively, the snare appeared to cut deeper than anticipated for the  ?     cold resection. For precaution two hemoclips were secured across the  ?     site. ?Impression:               - Three 2 to 3 mm polyps in the transverse colon  ?                          and in  the ascending colon, removed with a cold  ?                          snare. Resected and retrieved. Clips were placed. ?Moderate Sedation: ?     Not Applicable - Patient had care per Anesthesia. ?Recommendation:           - Patient has a contact number available for  ?                          emergencies. The signs and symptoms of potential  ?                          delayed complications were discussed with the  ?                          patient. Return to normal activities tomorrow.  ?                          Written discharge instructions were provided to the  ?                          patient. ?                          - Resume previous diet. ?                          - Continue present medications. ?                          -  Await pathology results. ?                          - Repeat colonoscopy in 5-10 years for surveillance. ?Procedure Code(s):        --- Professional --- ?                          [phone], Colonoscopy, flexible; with removal of  ?                          tumor(s), polyp(s), or other lesion(s) by snare  ?                          technique ?Diagnosis Code(s):        --- Professional --- ?                          Z12.11, Encounter for screening for malignant  ?                          neoplasm of colon ?                          K63.5, Polyp of colon ?CPT copyright 2019 American Medical Association. All rights reserved. ?The codes documented in this report are preliminary and upon coder review may  ?be revised to meet current compliance requirements. ?Carol Ada, MD ?Carol Ada, MD ?10/02/2021 8:56:19 AM ?This report has been signed electronically. ?Number of Addenda: 0 ?

## 2021-10-02 NOTE — Anesthesia Postprocedure Evaluation (Signed)
Anesthesia Post Note ? ?Patient: Courtney [name] ? ?Procedure(s) Performed: COLONOSCOPY WITH PROPOFOL ?POLYPECTOMY ? ?  ? ?Patient location during evaluation: PACU ?Anesthesia Type: MAC ?Level of consciousness: awake and alert ?Pain management: pain level controlled ?Vital Signs Assessment: post-procedure vital signs reviewed and stable ?Respiratory status: spontaneous breathing, nonlabored ventilation, respiratory function stable and patient connected to nasal cannula oxygen ?Cardiovascular status: stable and blood pressure returned to baseline ?Postop Assessment: no apparent nausea or vomiting ?Anesthetic complications: no ? ? ?No notable events documented. ? ?Last Vitals:  ?Vitals:  ? 10/02/21 0922 10/02/21 0928  ?BP: (!) 164/85 (!) 147/88  ?Pulse: 69 63  ?Resp: 15 10  ?Temp:    ?SpO2: 100% 100%  ?  ?Last Pain:  ?Vitals:  ? 10/02/21 0928  ?TempSrc:   ?PainSc: 0-No pain  ? ? ?  ?  ?  ?  ?  ?  ? ?Suzette Battiest E ? ? ? ? ?

## 2021-10-02 NOTE — Anesthesia Preprocedure Evaluation (Addendum)
Anesthesia Evaluation  ?Patient identified by MRN, date of birth, ID band ?Patient awake ? ? ? ?Reviewed: ?Allergy & Precautions, NPO status , Patient's Chart, lab work & pertinent test results ? ?Airway ?Mallampati: II ? ?TM Distance: >3 FB ?Neck ROM: Full ? ? ? Dental ? ?(+) Dental Advisory Given ?  ?Pulmonary ?neg pulmonary ROS,  ?  ?breath sounds clear to auscultation ? ? ? ? ? ? Cardiovascular ?hypertension, Pt. on medications ? ?Rhythm:Regular Rate:Normal ? ? ?  ?Neuro/Psych ?negative neurological ROS ?   ? GI/Hepatic ?Neg liver ROS, GERD  ,  ?Endo/Other  ?Morbid obesity ? Renal/GU ?negative Renal ROS  ? ?  ?Musculoskeletal ? ? Abdominal ?  ?Peds ? Hematology ?  ?Anesthesia Other Findings ? ? Reproductive/Obstetrics ? ?  ? ? ? ? ? ? ? ? ? ? ? ? ? ?  ?  ? ? ? ? ? ? ? ?Anesthesia Physical ?Anesthesia Plan ? ?ASA: 3 ? ?Anesthesia Plan: MAC  ? ?Post-op Pain Management: Minimal or no pain anticipated  ? ?Induction:  ? ?PONV Risk Score and Plan: 2 and Propofol infusion and Ondansetron ? ?Airway Management Planned: Natural Airway and Simple Face Mask ? ?Additional Equipment:  ? ?Intra-op Plan:  ? ?Post-operative Plan:  ? ?Informed Consent: I have reviewed the patients History and Physical, chart, labs and discussed the procedure including the risks, benefits and alternatives for the proposed anesthesia with the patient or authorized representative who has indicated his/her understanding and acceptance.  ? ? ? ? ? ?Plan Discussed with:  ? ?Anesthesia Plan Comments:   ? ? ? ? ? ?Anesthesia Quick Evaluation ? ?

## 2021-10-02 NOTE — Transfer of Care (Signed)
Immediate Anesthesia Transfer of Care Note ? ?Patient: Courtney [name] ? ?Procedure(s) Performed: COLONOSCOPY WITH PROPOFOL ?POLYPECTOMY ? ?Patient Location: PACU and Endoscopy Unit ? ?Anesthesia Type:MAC ? ?Level of Consciousness: awake, alert  and oriented ? ?Airway & Oxygen Therapy: Patient Spontanous Breathing and Patient connected to face mask ? ?Post-op Assessment: Report given to RN and Post -op Vital signs reviewed and stable ? ?Post vital signs: Reviewed and stable ? ?Last Vitals:  ?Vitals Value Taken Time  ?BP 119/53 10/02/21 0854  ?Temp    ?Pulse 83 10/02/21 0857  ?Resp 14 10/02/21 0857  ?SpO2 100 % 10/02/21 0857  ?Vitals shown include unvalidated device data. ? ?Last Pain:  ?Vitals:  ? 10/02/21 0811  ?TempSrc: Temporal  ?PainSc: 0-No pain  ?   ? ?  ? ?Complications: No notable events documented. ?

## 2021-10-05 ENCOUNTER — Ambulatory Visit (INDEPENDENT_AMBULATORY_CARE_PROVIDER_SITE_OTHER): Payer: BC Managed Care – PPO

## 2021-10-05 ENCOUNTER — Ambulatory Visit: Payer: BC Managed Care – PPO | Admitting: Podiatry

## 2021-10-05 ENCOUNTER — Encounter: Payer: Self-pay | Admitting: Podiatry

## 2021-10-05 DIAGNOSIS — M779 Enthesopathy, unspecified: Secondary | ICD-10-CM

## 2021-10-05 DIAGNOSIS — M7752 Other enthesopathy of left foot: Secondary | ICD-10-CM

## 2021-10-05 MED ORDER — TRIAMCINOLONE ACETONIDE 10 MG/ML IJ SUSP: 10.0000 mg | Freq: Once | INTRAMUSCULAR | Status: AC

## 2021-10-05 NOTE — Progress Notes (Signed)
Subjective:  ? ?Patient ID: Courtney [name], female   DOB: 51 y.o.   MRN: [id number]  ? ?HPI ?Patient states she is getting a lot of pain on the outside of her left foot does not remember specific injury but states its been going on for at least 3 months.  Patient is obese which is complicating factor does not smoke likes to be active ? ? ?Review of Systems  ?All other systems reviewed and are negative. ? ? ?   ?Objective:  ?Physical Exam ?Vitals and nursing note reviewed.  ?Constitutional:   ?   Appearance: She is well-developed.  ?Pulmonary:  ?   Effort: Pulmonary effort is normal.  ?Musculoskeletal:     ?   General: Normal range of motion.  ?Skin: ?   General: Skin is warm.  ?Neurological:  ?   Mental Status: She is alert.  ?  ?Neurovascular status intact muscle strength adequate range of motion within normal limits with patient noted to have exquisite discomfort lateral side left foot around the head of the fifth metatarsal with fluid buildup more plantarly and laterally.  Patient has good digital perfusion well oriented x3 ? ?   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 ?   ?Plan:  ?H&P reviewed condition sterile prep injected the fifth MPJ left 3 mg dexamethasone Kenalog 5 mg Xylocaine advised on offloading applied padding to take pressure off the area and reappoint as needed and also shoe gear modifications recommended ? ?X-rays indicate that there is no signs of bony injury in this area possibility for enlarged tailor's bunion ?   ? ? ?

## 2021-10-06 LAB — SURGICAL PATHOLOGY

## 2021-10-19 DIAGNOSIS — Z9012 Acquired absence of left breast and nipple: Secondary | ICD-10-CM | POA: Diagnosis not present

## 2021-10-19 DIAGNOSIS — C50911 Malignant neoplasm of unspecified site of right female breast: Secondary | ICD-10-CM | POA: Diagnosis not present

## 2021-10-20 DIAGNOSIS — I10 Essential (primary) hypertension: Secondary | ICD-10-CM | POA: Diagnosis not present

## 2021-10-20 DIAGNOSIS — K5904 Chronic idiopathic constipation: Secondary | ICD-10-CM | POA: Diagnosis not present

## 2021-10-29 DIAGNOSIS — E785 Hyperlipidemia, unspecified: Secondary | ICD-10-CM | POA: Diagnosis not present

## 2021-10-29 DIAGNOSIS — I10 Essential (primary) hypertension: Secondary | ICD-10-CM | POA: Diagnosis not present

## 2021-11-30 DIAGNOSIS — G4719 Other hypersomnia: Secondary | ICD-10-CM | POA: Diagnosis not present

## 2021-12-24 DIAGNOSIS — C50911 Malignant neoplasm of unspecified site of right female breast: Secondary | ICD-10-CM | POA: Diagnosis not present

## 2022-02-16 ENCOUNTER — Emergency Department (HOSPITAL_COMMUNITY)
Admission: EM | Admit: 2022-02-16 | Discharge: 2022-02-16 | Disposition: A | Discharge status: Home / Self Care | Payer: BC Managed Care – PPO | Attending: Emergency Medicine | Admitting: Emergency Medicine

## 2022-02-16 ENCOUNTER — Encounter (HOSPITAL_COMMUNITY): Payer: Self-pay

## 2022-02-16 ENCOUNTER — Emergency Department (HOSPITAL_COMMUNITY): Payer: BC Managed Care – PPO

## 2022-02-16 DIAGNOSIS — Z79899 Other long term (current) drug therapy: Secondary | ICD-10-CM | POA: Diagnosis not present

## 2022-02-16 DIAGNOSIS — M79643 Pain in unspecified hand: Secondary | ICD-10-CM | POA: Diagnosis not present

## 2022-02-16 DIAGNOSIS — M25462 Effusion, left knee: Secondary | ICD-10-CM | POA: Diagnosis not present

## 2022-02-16 DIAGNOSIS — Z853 Personal history of malignant neoplasm of breast: Secondary | ICD-10-CM | POA: Diagnosis not present

## 2022-02-16 DIAGNOSIS — M25561 Pain in right knee: Secondary | ICD-10-CM | POA: Diagnosis not present

## 2022-02-16 DIAGNOSIS — M25562 Pain in left knee: Secondary | ICD-10-CM | POA: Diagnosis not present

## 2022-02-16 DIAGNOSIS — S80812A Abrasion, left lower leg, initial encounter: Secondary | ICD-10-CM | POA: Diagnosis not present

## 2022-02-16 DIAGNOSIS — I10 Essential (primary) hypertension: Secondary | ICD-10-CM | POA: Diagnosis not present

## 2022-02-16 DIAGNOSIS — M79642 Pain in left hand: Secondary | ICD-10-CM | POA: Insufficient documentation

## 2022-02-16 DIAGNOSIS — Y92410 Unspecified street and highway as the place of occurrence of the external cause: Secondary | ICD-10-CM | POA: Diagnosis not present

## 2022-02-16 DIAGNOSIS — M25461 Effusion, right knee: Secondary | ICD-10-CM | POA: Diagnosis not present

## 2022-02-16 MED ORDER — METHOCARBAMOL 500 MG PO TABS: 500.0000 mg | ORAL_TABLET | Freq: Two times a day (BID) | ORAL | 0 refills | Status: AC

## 2022-02-16 MED ORDER — LIDOCAINE 5 % EX PTCH
2.0000 | MEDICATED_PATCH | CUTANEOUS | Status: DC
  Administered 2022-02-16: 2 via TRANSDERMAL
  Filled 2022-02-16: qty 2

## 2022-02-16 NOTE — Discharge Instructions (Addendum)
Follow-up with your primary care provider for recheck. Can apply lidocaine patches as needed as directed, these are available over-the-counter. Robaxin as needed as prescribed for muscle soreness. Warm compresses to sore muscles for 20 minutes at a time followed with gentle stretching.
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
## 2022-02-18 DIAGNOSIS — M25562 Pain in left knee: Secondary | ICD-10-CM | POA: Diagnosis not present

## 2022-02-18 DIAGNOSIS — M79642 Pain in left hand: Secondary | ICD-10-CM | POA: Diagnosis not present

## 2022-02-18 DIAGNOSIS — M25561 Pain in right knee: Secondary | ICD-10-CM | POA: Diagnosis not present

## 2022-02-22 DIAGNOSIS — S8001XA Contusion of right knee, initial encounter: Secondary | ICD-10-CM | POA: Diagnosis not present

## 2022-02-22 DIAGNOSIS — S8002XA Contusion of left knee, initial encounter: Secondary | ICD-10-CM | POA: Diagnosis not present

## 2022-03-24 DIAGNOSIS — S8001XD Contusion of right knee, subsequent encounter: Secondary | ICD-10-CM | POA: Diagnosis not present

## 2022-03-24 DIAGNOSIS — S8002XD Contusion of left knee, subsequent encounter: Secondary | ICD-10-CM | POA: Diagnosis not present

## 2022-04-16 DIAGNOSIS — M9901 Segmental and somatic dysfunction of cervical region: Secondary | ICD-10-CM | POA: Diagnosis not present

## 2022-04-16 DIAGNOSIS — M9902 Segmental and somatic dysfunction of thoracic region: Secondary | ICD-10-CM | POA: Diagnosis not present

## 2022-04-16 DIAGNOSIS — S161XXA Strain of muscle, fascia and tendon at neck level, initial encounter: Secondary | ICD-10-CM | POA: Diagnosis not present

## 2022-04-16 DIAGNOSIS — M9903 Segmental and somatic dysfunction of lumbar region: Secondary | ICD-10-CM | POA: Diagnosis not present

## 2022-04-19 DIAGNOSIS — M9901 Segmental and somatic dysfunction of cervical region: Secondary | ICD-10-CM | POA: Diagnosis not present

## 2022-04-19 DIAGNOSIS — M9902 Segmental and somatic dysfunction of thoracic region: Secondary | ICD-10-CM | POA: Diagnosis not present

## 2022-04-19 DIAGNOSIS — S161XXA Strain of muscle, fascia and tendon at neck level, initial encounter: Secondary | ICD-10-CM | POA: Diagnosis not present

## 2022-04-19 DIAGNOSIS — M9903 Segmental and somatic dysfunction of lumbar region: Secondary | ICD-10-CM | POA: Diagnosis not present

## 2022-04-22 DIAGNOSIS — M9903 Segmental and somatic dysfunction of lumbar region: Secondary | ICD-10-CM | POA: Diagnosis not present

## 2022-04-22 DIAGNOSIS — M9901 Segmental and somatic dysfunction of cervical region: Secondary | ICD-10-CM | POA: Diagnosis not present

## 2022-04-22 DIAGNOSIS — M9902 Segmental and somatic dysfunction of thoracic region: Secondary | ICD-10-CM | POA: Diagnosis not present

## 2022-04-22 DIAGNOSIS — S161XXA Strain of muscle, fascia and tendon at neck level, initial encounter: Secondary | ICD-10-CM | POA: Diagnosis not present

## 2022-04-23 DIAGNOSIS — S8001XD Contusion of right knee, subsequent encounter: Secondary | ICD-10-CM | POA: Diagnosis not present

## 2022-04-27 DIAGNOSIS — I10 Essential (primary) hypertension: Secondary | ICD-10-CM | POA: Diagnosis not present

## 2022-04-27 DIAGNOSIS — Z0000 Encounter for general adult medical examination without abnormal findings: Secondary | ICD-10-CM | POA: Diagnosis not present

## 2022-04-27 DIAGNOSIS — E785 Hyperlipidemia, unspecified: Secondary | ICD-10-CM | POA: Diagnosis not present

## 2022-04-27 DIAGNOSIS — Z23 Encounter for immunization: Secondary | ICD-10-CM | POA: Diagnosis not present

## 2022-04-27 DIAGNOSIS — D509 Iron deficiency anemia, unspecified: Secondary | ICD-10-CM | POA: Diagnosis not present

## 2022-05-11 DIAGNOSIS — M9902 Segmental and somatic dysfunction of thoracic region: Secondary | ICD-10-CM | POA: Diagnosis not present

## 2022-05-11 DIAGNOSIS — S161XXA Strain of muscle, fascia and tendon at neck level, initial encounter: Secondary | ICD-10-CM | POA: Diagnosis not present

## 2022-05-11 DIAGNOSIS — M9901 Segmental and somatic dysfunction of cervical region: Secondary | ICD-10-CM | POA: Diagnosis not present

## 2022-05-11 DIAGNOSIS — M9903 Segmental and somatic dysfunction of lumbar region: Secondary | ICD-10-CM | POA: Diagnosis not present

## 2022-05-20 DIAGNOSIS — M9902 Segmental and somatic dysfunction of thoracic region: Secondary | ICD-10-CM | POA: Diagnosis not present

## 2022-05-20 DIAGNOSIS — M9901 Segmental and somatic dysfunction of cervical region: Secondary | ICD-10-CM | POA: Diagnosis not present

## 2022-05-20 DIAGNOSIS — M9903 Segmental and somatic dysfunction of lumbar region: Secondary | ICD-10-CM | POA: Diagnosis not present

## 2022-05-20 DIAGNOSIS — S161XXA Strain of muscle, fascia and tendon at neck level, initial encounter: Secondary | ICD-10-CM | POA: Diagnosis not present

## 2022-05-28 DIAGNOSIS — S8002XD Contusion of left knee, subsequent encounter: Secondary | ICD-10-CM | POA: Diagnosis not present

## 2022-05-28 DIAGNOSIS — M79645 Pain in left finger(s): Secondary | ICD-10-CM | POA: Diagnosis not present

## 2022-06-03 DIAGNOSIS — S161XXA Strain of muscle, fascia and tendon at neck level, initial encounter: Secondary | ICD-10-CM | POA: Diagnosis not present

## 2022-06-03 DIAGNOSIS — M9902 Segmental and somatic dysfunction of thoracic region: Secondary | ICD-10-CM | POA: Diagnosis not present

## 2022-06-03 DIAGNOSIS — M9903 Segmental and somatic dysfunction of lumbar region: Secondary | ICD-10-CM | POA: Diagnosis not present

## 2022-06-03 DIAGNOSIS — M9901 Segmental and somatic dysfunction of cervical region: Secondary | ICD-10-CM | POA: Diagnosis not present

## 2022-06-23 DIAGNOSIS — M9903 Segmental and somatic dysfunction of lumbar region: Secondary | ICD-10-CM | POA: Diagnosis not present

## 2022-06-23 DIAGNOSIS — M9902 Segmental and somatic dysfunction of thoracic region: Secondary | ICD-10-CM | POA: Diagnosis not present

## 2022-06-23 DIAGNOSIS — S161XXA Strain of muscle, fascia and tendon at neck level, initial encounter: Secondary | ICD-10-CM | POA: Diagnosis not present

## 2022-06-23 DIAGNOSIS — M9901 Segmental and somatic dysfunction of cervical region: Secondary | ICD-10-CM | POA: Diagnosis not present

## 2022-07-05 DIAGNOSIS — J069 Acute upper respiratory infection, unspecified: Secondary | ICD-10-CM | POA: Diagnosis not present

## 2022-07-05 DIAGNOSIS — J3489 Other specified disorders of nose and nasal sinuses: Secondary | ICD-10-CM | POA: Diagnosis not present

## 2022-07-08 DIAGNOSIS — R519 Headache, unspecified: Secondary | ICD-10-CM | POA: Diagnosis not present

## 2022-07-08 DIAGNOSIS — U071 COVID-19: Secondary | ICD-10-CM | POA: Diagnosis not present

## 2022-07-12 DIAGNOSIS — M9902 Segmental and somatic dysfunction of thoracic region: Secondary | ICD-10-CM | POA: Diagnosis not present

## 2022-07-12 DIAGNOSIS — M9901 Segmental and somatic dysfunction of cervical region: Secondary | ICD-10-CM | POA: Diagnosis not present

## 2022-07-12 DIAGNOSIS — M9903 Segmental and somatic dysfunction of lumbar region: Secondary | ICD-10-CM | POA: Diagnosis not present

## 2022-07-12 DIAGNOSIS — S161XXA Strain of muscle, fascia and tendon at neck level, initial encounter: Secondary | ICD-10-CM | POA: Diagnosis not present

## 2022-07-28 DIAGNOSIS — I10 Essential (primary) hypertension: Secondary | ICD-10-CM | POA: Diagnosis not present

## 2022-07-28 DIAGNOSIS — E785 Hyperlipidemia, unspecified: Secondary | ICD-10-CM | POA: Diagnosis not present

## 2022-07-28 DIAGNOSIS — D509 Iron deficiency anemia, unspecified: Secondary | ICD-10-CM | POA: Diagnosis not present

## 2022-08-06 ENCOUNTER — Ambulatory Visit: Payer: BC Managed Care – PPO | Admitting: Podiatry

## 2022-08-06 ENCOUNTER — Encounter: Payer: Self-pay | Admitting: Podiatry

## 2022-08-06 DIAGNOSIS — M722 Plantar fascial fibromatosis: Secondary | ICD-10-CM

## 2022-08-06 DIAGNOSIS — M7661 Achilles tendinitis, right leg: Secondary | ICD-10-CM | POA: Diagnosis not present

## 2022-08-07 NOTE — Progress Notes (Signed)
Subjective:   Patient ID: Courtney [name], female   DOB: 52 y.o.   MRN: [id number]   HPI Patient presents stating she wore an abnormal shoe and developed pain in her right heel that was intense but only lasted for a few days and seems improved now.  She still concerns wants to see if she has bone spur formation   ROS      Objective:  Physical Exam  Neurovascular status intact with right heel pain of a very mild nature now more in the posterior heel mild plantar that has improved with no swelling or warmth     Assessment:  Probability for low-grade Achilles tendinitis does not appear to be of a significant nature currently     Plan:  Reviewed condition and this is something that I think she can just use stretch heel lift and anti-inflammatories as needed.  I am not going to see her back unless she needs me and may require more advanced treatment  X-rays indicate spur formation of the posterior heel not nothing plantar currently

## 2022-08-09 DIAGNOSIS — M9905 Segmental and somatic dysfunction of pelvic region: Secondary | ICD-10-CM | POA: Diagnosis not present

## 2022-08-09 DIAGNOSIS — M9902 Segmental and somatic dysfunction of thoracic region: Secondary | ICD-10-CM | POA: Diagnosis not present

## 2022-08-09 DIAGNOSIS — M9901 Segmental and somatic dysfunction of cervical region: Secondary | ICD-10-CM | POA: Diagnosis not present

## 2022-08-09 DIAGNOSIS — M9903 Segmental and somatic dysfunction of lumbar region: Secondary | ICD-10-CM | POA: Diagnosis not present

## 2022-10-12 DIAGNOSIS — H3589 Other specified retinal disorders: Secondary | ICD-10-CM | POA: Diagnosis not present

## 2022-12-30 DIAGNOSIS — I10 Essential (primary) hypertension: Secondary | ICD-10-CM | POA: Diagnosis not present

## 2022-12-30 DIAGNOSIS — J019 Acute sinusitis, unspecified: Secondary | ICD-10-CM | POA: Diagnosis not present

## 2023-02-02 DIAGNOSIS — J069 Acute upper respiratory infection, unspecified: Secondary | ICD-10-CM | POA: Diagnosis not present

## 2023-02-02 DIAGNOSIS — H109 Unspecified conjunctivitis: Secondary | ICD-10-CM | POA: Diagnosis not present

## 2023-02-02 DIAGNOSIS — J029 Acute pharyngitis, unspecified: Secondary | ICD-10-CM | POA: Diagnosis not present

## 2023-04-20 DIAGNOSIS — Z124 Encounter for screening for malignant neoplasm of cervix: Secondary | ICD-10-CM | POA: Diagnosis not present

## 2023-04-20 DIAGNOSIS — Z30431 Encounter for routine checking of intrauterine contraceptive device: Secondary | ICD-10-CM | POA: Diagnosis not present

## 2023-04-20 DIAGNOSIS — Z01419 Encounter for gynecological examination (general) (routine) without abnormal findings: Secondary | ICD-10-CM | POA: Diagnosis not present

## 2023-04-20 DIAGNOSIS — Z1331 Encounter for screening for depression: Secondary | ICD-10-CM | POA: Diagnosis not present

## 2023-05-05 DIAGNOSIS — Z0000 Encounter for general adult medical examination without abnormal findings: Secondary | ICD-10-CM | POA: Diagnosis not present

## 2023-05-05 DIAGNOSIS — I10 Essential (primary) hypertension: Secondary | ICD-10-CM | POA: Diagnosis not present

## 2023-05-05 DIAGNOSIS — D509 Iron deficiency anemia, unspecified: Secondary | ICD-10-CM | POA: Diagnosis not present

## 2023-05-05 DIAGNOSIS — Z23 Encounter for immunization: Secondary | ICD-10-CM | POA: Diagnosis not present

## 2023-05-05 DIAGNOSIS — E785 Hyperlipidemia, unspecified: Secondary | ICD-10-CM | POA: Diagnosis not present

## 2023-05-10 DIAGNOSIS — Z1231 Encounter for screening mammogram for malignant neoplasm of breast: Secondary | ICD-10-CM | POA: Diagnosis not present

## 2023-05-10 DIAGNOSIS — T8332XD Displacement of intrauterine contraceptive device, subsequent encounter: Secondary | ICD-10-CM | POA: Diagnosis not present

## 2023-05-10 DIAGNOSIS — Z30432 Encounter for removal of intrauterine contraceptive device: Secondary | ICD-10-CM | POA: Diagnosis not present

## 2023-06-03 DIAGNOSIS — K5904 Chronic idiopathic constipation: Secondary | ICD-10-CM | POA: Diagnosis not present

## 2023-06-03 DIAGNOSIS — I10 Essential (primary) hypertension: Secondary | ICD-10-CM | POA: Diagnosis not present

## 2023-06-03 DIAGNOSIS — R748 Abnormal levels of other serum enzymes: Secondary | ICD-10-CM | POA: Diagnosis not present

## 2023-06-16 DIAGNOSIS — H16041 Marginal corneal ulcer, right eye: Secondary | ICD-10-CM | POA: Diagnosis not present

## 2023-06-17 DIAGNOSIS — H16041 Marginal corneal ulcer, right eye: Secondary | ICD-10-CM | POA: Diagnosis not present

## 2023-06-21 DIAGNOSIS — H16041 Marginal corneal ulcer, right eye: Secondary | ICD-10-CM | POA: Diagnosis not present

## 2023-06-27 DIAGNOSIS — H16041 Marginal corneal ulcer, right eye: Secondary | ICD-10-CM | POA: Diagnosis not present

## 2023-09-14 DIAGNOSIS — I10 Essential (primary) hypertension: Secondary | ICD-10-CM | POA: Diagnosis not present

## 2023-09-14 DIAGNOSIS — M545 Low back pain, unspecified: Secondary | ICD-10-CM | POA: Diagnosis not present

## 2023-10-12 DIAGNOSIS — H40011 Open angle with borderline findings, low risk, right eye: Secondary | ICD-10-CM | POA: Diagnosis not present

## 2023-11-04 DIAGNOSIS — Z23 Encounter for immunization: Secondary | ICD-10-CM | POA: Diagnosis not present

## 2023-11-04 DIAGNOSIS — T451X5A Adverse effect of antineoplastic and immunosuppressive drugs, initial encounter: Secondary | ICD-10-CM | POA: Diagnosis not present

## 2023-11-04 DIAGNOSIS — Z853 Personal history of malignant neoplasm of breast: Secondary | ICD-10-CM | POA: Diagnosis not present

## 2023-11-04 DIAGNOSIS — D509 Iron deficiency anemia, unspecified: Secondary | ICD-10-CM | POA: Diagnosis not present

## 2023-11-04 DIAGNOSIS — I10 Essential (primary) hypertension: Secondary | ICD-10-CM | POA: Diagnosis not present

## 2023-11-04 DIAGNOSIS — E785 Hyperlipidemia, unspecified: Secondary | ICD-10-CM | POA: Diagnosis not present

## 2023-11-04 DIAGNOSIS — L658 Other specified nonscarring hair loss: Secondary | ICD-10-CM | POA: Diagnosis not present

## 2023-11-26 DIAGNOSIS — R0981 Nasal congestion: Secondary | ICD-10-CM | POA: Diagnosis not present

## 2023-11-26 DIAGNOSIS — J0140 Acute pansinusitis, unspecified: Secondary | ICD-10-CM | POA: Diagnosis not present

## 2023-11-26 DIAGNOSIS — R051 Acute cough: Secondary | ICD-10-CM | POA: Diagnosis not present

## 2023-11-28 DIAGNOSIS — Z4420 Encounter for fitting and adjustment of artificial eye, unspecified: Secondary | ICD-10-CM | POA: Diagnosis not present

## 2024-02-10 DIAGNOSIS — Q111 Other anophthalmos: Secondary | ICD-10-CM | POA: Diagnosis not present

## 2024-03-13 ENCOUNTER — Ambulatory Visit: Payer: BC Managed Care – PPO | Admitting: Dermatology

## 2024-03-13 ENCOUNTER — Encounter: Payer: Self-pay | Admitting: Dermatology

## 2024-03-13 VITALS — BP 127/79

## 2024-03-13 DIAGNOSIS — L308 Other specified dermatitis: Secondary | ICD-10-CM | POA: Diagnosis not present

## 2024-03-13 DIAGNOSIS — L6681 Central centrifugal cicatricial alopecia: Secondary | ICD-10-CM

## 2024-03-13 DIAGNOSIS — L209 Atopic dermatitis, unspecified: Secondary | ICD-10-CM

## 2024-03-13 MED ORDER — CLOBETASOL PROPIONATE 0.05 % EX OINT: 1.0000 | TOPICAL_OINTMENT | Freq: Two times a day (BID) | CUTANEOUS | 2 refills | Status: AC

## 2024-03-13 MED ORDER — SAFETY SEAL MISCELLANEOUS MISC: 5 refills | Status: DC

## 2024-03-13 NOTE — Progress Notes (Signed)
   New Patient Visit   Subjective  Courtney [name] is a 53 y.o. female who presents for a NEW PATIENT appointment to be examined for the concerns as listed below.   Hair Loss: Pt stated she is still experiencing hair loss post breast cancer. She was Dx in 2004 and finished Tx in 2009. She is current being seen by a hair care specialist who is using serums and hair mask. She has tried OTC Rogaine but did not see much improvement. Pt has also tried kenalog  injections but did not see improvement. She previously was a patient of Dr. Mollie.   Dermatitis: Pt has some dry/ flaky areas on the palms of hands and R lower leg that are itchy rating in 8 out of 10. She has been Rx by PCP [facility] 0.05% oint first but it was ineffective and was switched to clobetasol 0.05% cream BID which has helped the severe itching. Pt would like to know what type of skin issue she has as her father had something similar.    Are you nursing, pregnant or trying to conceive? No   No Hx of Bx. No family Hx of skin cancer.   The following portions of the chart were reviewed this encounter and updated as appropriate: medications, allergies, medical history  Review of Systems:  No other skin or systemic complaints except as noted in HPI or Assessment and Plan.  Objective  Well appearing patient in no apparent distress; mood and affect are within normal limits.   A focused examination was performed of the following areas: scalp & wrist   Relevant exam findings are noted in the Assessment and Plan.                       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 Inflammation control is crucial to prevent further loss. Current treatments inadequate for inflammation. Some follicles scarred, but treatment may aid regrowth in less affected areas. -Extensive counseling on the causes, treatments and prognoses of CCCA  - Prescribed compounded  topical clobetasol, minoxidil, and metformin. - Instructed to apply a thin layer every morning. - Advised it will take four months to see hair growth. - Explained prescription from [place]  pharmacy, $45/month.  Hand dermatitis (eczema vs. 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 Symptoms suggest psoriasis with potential psoriatic arthritis due to joint stiffness. Current treatment insufficient. - Prescribed clobetasol cream for affected areas. - Instructed to use clobetasol twice daily for two weeks, then break for two weeks. - Advised to use thick balm or Aquaphor during break.  ATOPIC DERMATITIS, UNSPECIFIED TYPE   Related Medications clobetasol ointment (TEMOVATE) 0.05 % Apply 1 Application topically 2 (two) times daily. Use 2 weeks on, 2 weeks off.  Return for CCCA, ALOPECIA, ATOPIC DERM/ECZEMA.   Documentation: I have reviewed the above documentation for accuracy and completeness, and I agree with the above.  I, Shirron Maranda, CMA, am acting as scribe for [employer], DO.   Delon Lenis, DO

## 2024-03-13 NOTE — Patient Instructions (Addendum)
 VISIT SUMMARY:  Today, you were seen for hair loss and eczema. We discussed your history of hair loss, which began in 2004, and your current treatment regimen. We also reviewed your eczema, which affects your hands and is currently better than usual with the use of triamcinolone .  YOUR PLAN:  -CENTRAL CENTRIFUGAL CICATRICIAL ALOPECIA (CCCA): CCCA is a type of hair loss that causes scarring and primarily affects the top of the scalp. It is important to control inflammation to prevent further hair loss. We have prescribed a compounded topical treatment that includes clobetasol, minoxidil, and metformin. You should apply a thin layer of this treatment every morning. It may take about four months to see hair growth. The prescription will be filled by a pharmacy in [place]  and will cost $45 per month.  -HAND DERMATITIS (ECZEMA VS. PSORIASIS): Hand dermatitis is a chronic condition that causes itchy and inflamed skin. Your symptoms suggest it might be psoriasis, which can also affect the joints. We have prescribed clobetasol cream for the affected areas. You should use the cream twice daily for two weeks, then take a break for two weeks. During the break, use a thick balm or Aquaphor to keep your skin moisturized.  INSTRUCTIONS:  Please follow up in four months to assess the progress of your hair growth and the condition of your eczema. If you experience any new symptoms or have concerns before then, do not hesitate to contact our office.    CCCA Overview  What is central centrifugal cicatricial alopecia? Central Centrifugal Cicatricial Alopecia (CCCA) is a form of scarring alopecia on the scalp that results in permanent hair loss. It is the most common form of scarring hair loss seen in black women. However, it may be seen in men and among persons of all races and hair colour (though rarely). Middle-aged women are most commonly affected.  What is the cause of central centrifugal  cicatricial alopecia? The exact cause of CCCA is unknown and is likely multifactorial. A genetic component has been suggested, with a link to mutations of the gene PADI3, which encodes peptidyl arginine deiminase, type III (PADI3), an enzyme that modifies proteins that are essential to formation of the hair-shaft. Hair care practices, such as the use of the hot comb, relaxers, tight extensions and weaves, have been implicated for decades, but studies have not shown a consistent link. Other proposed causative factors include fungal infections, bacterial infections, autoimmune disease, and genetics. One study has shown an association with medical conditions such as type 2 diabetes mellitus.  What patients often ask their dermatologist about CCCA Board-certified dermatologists are the medical doctors who have the most experience diagnosing and treating hair loss, including CCCA. When patients see their dermatologist about CCCA, they often ask the following questions.  Why am I going bald in the center of my head? This type of hair loss often begins in the center of the scalp as a small, balding, and round patch that grows over time.  While more common in Black women, this type of hair loss develops in men and people of all races.  Central centrifugal cicatricial alopecia (CCCA) The first sign of CCCA is often noticeable hair loss in the center, or crown, of your scalp.  Woman with CCCA has hair loss in the center of her scalp If you have this type of hair loss, you want to treat it early. Starting treatment early can prevent CCCA from spreading outward and causing  more permanent hair loss. Some people also have hair regrowth when treatment starts early.  Early treatment is important because this disease destroys hair follicles. These are tiny pores (or openings) in your scalp from which your hair grows. Once a hair follicle has been destroyed, it is replaced by scar tissue. This is why hair loss can be  permanent.  You can tell when scarring develops by looking at your scalp. After many hair follicles develop scars, you'll have a bald area that feels smooth to the touch.  Can CCCA be reversed? You may be able to reverse (or grow some hair) if you treat CCCA early before hair follicles develop scars. Once a hair follicle scars completely, treatment to regrow hair becomes difficult and hair loss is more likely to be permanent.  While treatment for CCCA may not always be able to reverse the disease and regrow hair, treatment can prevent CCCA from destroying more hair follicles. This means that the patch of hair loss that you have can remain the same size instead of getting larger. Without treatment, CCCA often continues to destroy hair follicles, and the patch of hair loss becomes larger and may eventually involve most of the scalp.  How is CCCA treated? You cannot effectively treat CCCA with hair loss treatments that you can buy online or in stores. A dermatologist must prescribe medication to treat this type of hair loss.  A dermatologist can also give you self-care tips that can make treatment more effective.  Even if you don't want to treat the hair loss, it's important to see a board-certified dermatologist if you have patchy hair loss in the center of your head. Occasionally, CCCA is a sign of a medical problem like a thyroid  condition. The hair loss could also be a sign that you need more iron or certain vitamins.  If you have noticeable hair loss on the top of your head, dermatologists encourage you to make an appointment today. As tempting as it can be to hide a small area of hair loss, remember that the small area tends to get larger and larger without treatment.  Why does the baldness start from the top of the head? Exactly why CCCA usually starts on the top of the head is not completely understood.  In studying CCCA, dermatologists have learned that it is a unique type of hair loss  that usually starts on the top of the head. As CCCA progresses, the round patch grows.  Studies have also found that:  Where this type of hair loss develops, there's inflammation.  CCCA is the most common type of scarring hair loss for women of African descent.  In the [place] , it's the most frequent cause of scarring hair loss in African American women and usually begins during middle age.  CCCA runs in families.  Noticeable hair loss is one sign of CCCA. Some people feel small, raised bumps on their scalp. Many people who have untreated CCCA say that their scalp burns, stings, or itches.  You may develop other signs or symptoms. You'll find more information about these, along with pictures at Central centrifugal cicatricial alopecia: Symptoms.    Important Information  Due to recent changes in healthcare laws, you may see results of your pathology and/or laboratory studies on MyChart before the doctors have had a chance to review them. We understand that in some cases there may be results that are confusing or concerning to you. Please understand that not all results are received at  the same time and often the doctors may need to interpret multiple results in order to provide you with the best plan of care or course of treatment. Therefore, we ask that you please give us  2 business days to thoroughly review all your results before contacting the office for clarification. Should we see a critical lab result, you will be contacted sooner.   If You Need Anything After Your Visit  If you have any questions or concerns for your doctor, please call our main line at [phone] If no one answers, please leave a voicemail as directed and we will return your call as soon as possible. Messages left after 4 pm will be answered the following business day.   You may also send us  a message via MyChart. We typically respond to MyChart messages within 1-2 business days.  For prescription  refills, please ask your pharmacy to contact our office. Our fax number is [phone].  If you have an urgent issue when the clinic is closed that cannot wait until the next business day, you can page your doctor at the number below.    Please note that while we do our best to be available for urgent issues outside of office hours, we are not available 24/7.   If you have an urgent issue and are unable to reach us , you may choose to seek medical care at your doctor's office, retail clinic, urgent care center, or emergency room.  If you have a medical emergency, please immediately call 911 or go to the emergency department. In the event of inclement weather, please call our main line at [phone] for an update on the status of any delays or closures.  Dermatology Medication Tips: Please keep the boxes that topical medications come in in order to help keep track of the instructions about where and how to use these. Pharmacies typically print the medication instructions only on the boxes and not directly on the medication tubes.   If your medication is too expensive, please contact our office at [phone] or send us  a message through MyChart.   We are unable to tell what your co-pay for medications will be in advance as this is different depending on your insurance coverage. However, we may be able to find a substitute medication at lower cost or fill out paperwork to get insurance to cover a needed medication.   If a prior authorization is required to get your medication covered by your insurance company, please allow us  1-2 business days to complete this process.  Drug prices often vary depending on where the prescription is filled and some pharmacies may offer cheaper prices.  The website www.goodrx.com contains coupons for medications through different pharmacies. The prices here do not account for what the cost may be with help from insurance (it may be cheaper with your insurance),  but the website can give you the price if you did not use any insurance.  - You can print the associated coupon and take it with your prescription to the pharmacy.  - You may also stop by our office during regular business hours and pick up a GoodRx coupon card.  - If you need your prescription sent electronically to a different pharmacy, notify our office through [facility] or by phone at [phone]

## 2024-04-18 DIAGNOSIS — S82142A Displaced bicondylar fracture of left tibia, initial encounter for closed fracture: Secondary | ICD-10-CM | POA: Diagnosis not present

## 2024-04-18 DIAGNOSIS — M7661 Achilles tendinitis, right leg: Secondary | ICD-10-CM | POA: Diagnosis not present

## 2024-05-07 DIAGNOSIS — D509 Iron deficiency anemia, unspecified: Secondary | ICD-10-CM | POA: Diagnosis not present

## 2024-05-07 DIAGNOSIS — I10 Essential (primary) hypertension: Secondary | ICD-10-CM | POA: Diagnosis not present

## 2024-05-07 DIAGNOSIS — E785 Hyperlipidemia, unspecified: Secondary | ICD-10-CM | POA: Diagnosis not present

## 2024-05-07 DIAGNOSIS — Z23 Encounter for immunization: Secondary | ICD-10-CM | POA: Diagnosis not present

## 2024-05-07 DIAGNOSIS — R748 Abnormal levels of other serum enzymes: Secondary | ICD-10-CM | POA: Diagnosis not present

## 2024-05-07 DIAGNOSIS — Z0000 Encounter for general adult medical examination without abnormal findings: Secondary | ICD-10-CM | POA: Diagnosis not present

## 2024-05-23 DIAGNOSIS — Z5971 Insufficient health insurance coverage: Secondary | ICD-10-CM | POA: Diagnosis not present

## 2024-05-23 DIAGNOSIS — M7661 Achilles tendinitis, right leg: Secondary | ICD-10-CM | POA: Diagnosis not present

## 2024-05-29 DIAGNOSIS — Z4420 Encounter for fitting and adjustment of artificial eye, unspecified: Secondary | ICD-10-CM | POA: Diagnosis not present

## 2024-07-17 ENCOUNTER — Other Ambulatory Visit: Payer: Self-pay

## 2024-07-17 DIAGNOSIS — L6681 Central centrifugal cicatricial alopecia: Secondary | ICD-10-CM

## 2024-07-17 MED ORDER — SAFETY SEAL MISCELLANEOUS MISC: 5 refills | Status: AC

## 2024-08-23 ENCOUNTER — Ambulatory Visit: Admitting: Dermatology
